# Patient Record
Sex: Female | Born: 1995 | Race: Black or African American | Hispanic: No | Marital: Married | Smoking: Former smoker
Health system: Southern US, Community
[De-identification: ages and names within clinical notes are randomized; demographics above are authoritative.]

## PROBLEM LIST (undated history)

## (undated) ENCOUNTER — Inpatient Hospital Stay (HOSPITAL_COMMUNITY): Payer: Self-pay

## (undated) DIAGNOSIS — N12 Tubulo-interstitial nephritis, not specified as acute or chronic: Secondary | ICD-10-CM

## (undated) DIAGNOSIS — E119 Type 2 diabetes mellitus without complications: Secondary | ICD-10-CM

## (undated) DIAGNOSIS — O24419 Gestational diabetes mellitus in pregnancy, unspecified control: Secondary | ICD-10-CM

## (undated) DIAGNOSIS — I10 Essential (primary) hypertension: Secondary | ICD-10-CM

## (undated) HISTORY — PX: NO PAST SURGERIES: SHX2092

---

## 2014-01-03 ENCOUNTER — Emergency Department (INDEPENDENT_AMBULATORY_CARE_PROVIDER_SITE_OTHER)
Admission: EM | Admit: 2014-01-03 | Discharge: 2014-01-03 | Disposition: A | Discharge status: Home / Self Care | Payer: Medicaid Other | Source: Home / Self Care | Attending: Family Medicine | Admitting: Family Medicine

## 2014-01-03 ENCOUNTER — Encounter (HOSPITAL_COMMUNITY): Payer: Self-pay | Admitting: Emergency Medicine

## 2014-01-03 DIAGNOSIS — N12 Tubulo-interstitial nephritis, not specified as acute or chronic: Secondary | ICD-10-CM

## 2014-01-03 HISTORY — DX: Essential (primary) hypertension: I10

## 2014-01-03 LAB — POCT URINALYSIS DIP (DEVICE)
BILIRUBIN URINE: NEGATIVE
Glucose, UA: NEGATIVE mg/dL
KETONES UR: 40 mg/dL — AB
Nitrite: POSITIVE — AB
PH: 6 (ref 5.0–8.0)
Protein, ur: 100 mg/dL — AB
Specific Gravity, Urine: >=1.03 (ref 1.005–1.030)
Urobilinogen, UA: 0.2 mg/dL (ref 0.0–1.0)

## 2014-01-03 LAB — POCT PREGNANCY, URINE: Preg Test, Ur: NEGATIVE

## 2014-01-03 MED ORDER — CEFTRIAXONE SODIUM 1 G IJ SOLR
INTRAMUSCULAR | Status: AC
  Filled 2014-01-03: qty 10

## 2014-01-03 MED ORDER — CEPHALEXIN 500 MG PO CAPS: 500.0000 mg | ORAL_CAPSULE | Freq: Four times a day (QID) | ORAL | Status: DC

## 2014-01-03 MED ORDER — PHENAZOPYRIDINE HCL 95 MG PO TABS: 95.0000 mg | ORAL_TABLET | Freq: Three times a day (TID) | ORAL | Status: DC | PRN

## 2014-01-03 MED ORDER — LIDOCAINE HCL (PF) 1 % IJ SOLN
INTRAMUSCULAR | Status: AC
  Filled 2014-01-03: qty 5

## 2014-01-03 MED ORDER — CEFTRIAXONE SODIUM 1 G IJ SOLR
1.0000 g | Freq: Once | INTRAMUSCULAR | Status: AC
  Administered 2014-01-03: 1 g via INTRAMUSCULAR

## 2014-01-03 NOTE — ED Notes (Signed)
Pt given injection will discharge at 12:00 p.m.  Mw,cma

## 2014-01-03 NOTE — ED Notes (Signed)
C/o  Lower right sided flank and back pain.  Since Tuesday.   Dysuria.  Urgency.  Bladder spasms.  Vomiting.   Fever.   Tylenol taken at 9:30 a.m.

## 2014-01-03 NOTE — Discharge Instructions (Signed)
Thank you for coming in today. Start taking Keflex this evening. Take every 6 hours.  Use Tylenol and Pyridium as needed for pain. Return if not improving or worsening. Go to the emergency room if you get dramatically worse. If your belly pain worsens, or you have high fever, bad vomiting, blood in your stool or black tarry stool go to the Emergency Room.    Pyelonephritis, Adult Pyelonephritis is a kidney infection. In general, there are 2 main types of pyelonephritis:  Infections that come on quickly without any warning (acute pyelonephritis).  Infections that persist for a long period of time (chronic pyelonephritis). CAUSES  Two main causes of pyelonephritis are:  Bacteria traveling from the bladder to the kidney. This is a problem especially in pregnant women. The urine in the bladder can become filled with bacteria from multiple causes, including:  Inflammation of the prostate gland (prostatitis).  Sexual intercourse in females.  Bladder infection (cystitis).  Bacteria traveling from the bloodstream to the tissue part of the kidney. Problems that may increase your risk of getting a kidney infection include:  Diabetes.  Kidney stones or bladder stones.  Cancer.  Catheters placed in the bladder.  Other abnormalities of the kidney or ureter. SYMPTOMS   Abdominal pain.  Pain in the side or flank area.  Fever.  Chills.  Upset stomach.  Blood in the urine (dark urine).  Frequent urination.  Strong or persistent urge to urinate.  Burning or stinging when urinating. DIAGNOSIS  Your caregiver may diagnose your kidney infection based on your symptoms. A urine sample may also be taken. TREATMENT  In general, treatment depends on how severe the infection is.   If the infection is mild and caught early, your caregiver may treat you with oral antibiotics and send you home.  If the infection is more severe, the bacteria may have gotten into the bloodstream. This  will require intravenous (IV) antibiotics and a hospital stay. Symptoms may include:  High fever.  Severe flank pain.  Shaking chills.  Even after a hospital stay, your caregiver may require you to be on oral antibiotics for a period of time.  Other treatments may be required depending upon the cause of the infection. HOME CARE INSTRUCTIONS   Take your antibiotics as directed. Finish them even if you start to feel better.  Make an appointment to have your urine checked to make sure the infection is gone.  Drink enough fluids to keep your urine clear or pale yellow.  Take medicines for the bladder if you have urgency and frequency of urination as directed by your caregiver. SEEK IMMEDIATE MEDICAL CARE IF:   You have a fever or persistent symptoms for more than 2-3 days.  You have a fever and your symptoms suddenly get worse.  You are unable to take your antibiotics or fluids.  You develop shaking chills.  You experience extreme weakness or fainting.  There is no improvement after 2 days of treatment. MAKE SURE YOU:  Understand these instructions.  Will watch your condition.  Will get help right away if you are not doing well or get worse. Document Released: 08/09/2005 Document Revised: 02/08/2012 Document Reviewed: 01/13/2011 [facility] Patient Information 2014 [place]Care, [place].

## 2014-01-03 NOTE — ED Provider Notes (Signed)
Jill Madden is a 18 y.o. female who presents to Urgent Care today for 2 days of urinary frequency and urgency. Symptoms worsened yesterday to include right flank pain. She notes subjective fevers and chills. She's had one or 2 episodes of vomiting. She denies any vaginal discharge. She says her symptoms are worse than but consistent with prior UTI. She's tried Tylenol which helps her pain some.   Past Medical History  Diagnosis Date  . Hypertension    History  Substance Use Topics  . Smoking status: Current Every Day Smoker -- 0.50 packs/day    Types: Cigarettes  . Smokeless tobacco: Not on file  . Alcohol Use: Yes   ROS as above Medications: Current Facility-Administered Medications  Medication Dose Route Frequency Provider Last Rate Last Dose  . cefTRIAXone (ROCEPHIN) injection 1 g  1 g Intramuscular Once Rodolph BongEvan S Farouk Vivero, MD       Current Outpatient Prescriptions  Medication Sig Dispense Refill  . cephALEXin (KEFLEX) 500 MG capsule Take 1 capsule (500 mg total) by mouth 4 (four) times daily.  40 capsule  0  . phenazopyridine (PYRIDIUM) 95 MG tablet Take 1 tablet (95 mg total) by mouth 3 (three) times daily as needed for pain.  10 tablet  0    Exam:  BP 127/81  Pulse 90  Temp(Src) 100.8 F (38.2 C) (Oral)  Resp 18  SpO2 95% Gen: Well NAD HEENT: EOMI,  MMM Lungs: Normal work of breathing. CTABL Heart: RRR no MRG Abd: NABS, Soft. NT, ND right-sided CVA tenderness to percussion present Exts: Brisk capillary refill, warm and well perfused.   Patient was given 1 g IM ceftriaxone  Results for orders placed during the hospital encounter of 01/03/14 (from the past 24 hour(s))  POCT URINALYSIS DIP (DEVICE)     Status: Abnormal   Collection Time    01/03/14 11:18 AM      Result Value Ref Range   Glucose, UA NEGATIVE  NEGATIVE mg/dL   Bilirubin Urine NEGATIVE  NEGATIVE   Ketones, ur 40 (*) NEGATIVE mg/dL   Specific Gravity, Urine >=1.030  1.005 - 1.030   Hgb urine dipstick  LARGE (*) NEGATIVE   pH 6.0  5.0 - 8.0   Protein, ur 100 (*) NEGATIVE mg/dL   Urobilinogen, UA 0.2  0.0 - 1.0 mg/dL   Nitrite POSITIVE (*) NEGATIVE   Leukocytes, UA SMALL (*) NEGATIVE  POCT PREGNANCY, URINE     Status: None   Collection Time    01/03/14 11:26 AM      Result Value Ref Range   Preg Test, Ur NEGATIVE  NEGATIVE   No results found.  Assessment and Plan: 18 y.o. female with pyelonephritis. Plan to treat with IM ceftriaxone (1 g) in office today. Additionally sent home with oral Keflex prescription. Additionally perennial for pain as needed. Return to clinic if not improving significantly. Urine culture pending  Discussed warning signs or symptoms. Please see discharge instructions. Patient expresses understanding.    Rodolph BongEvan S Daxon Kyne, MD 01/03/14 [phone]

## 2014-01-05 LAB — URINE CULTURE: Colony Count: >=100000

## 2014-06-19 ENCOUNTER — Encounter (HOSPITAL_COMMUNITY): Payer: Self-pay | Admitting: Emergency Medicine

## 2014-06-19 ENCOUNTER — Other Ambulatory Visit (HOSPITAL_COMMUNITY)
Admission: RE | Admit: 2014-06-19 | Discharge: 2014-06-19 | Disposition: A | Discharge status: Home / Self Care | Payer: Medicaid Other | Source: Ambulatory Visit | Attending: Emergency Medicine | Admitting: Emergency Medicine

## 2014-06-19 ENCOUNTER — Emergency Department (INDEPENDENT_AMBULATORY_CARE_PROVIDER_SITE_OTHER)
Admission: EM | Admit: 2014-06-19 | Discharge: 2014-06-19 | Disposition: A | Discharge status: Home / Self Care | Payer: Medicaid Other | Source: Home / Self Care | Attending: Emergency Medicine | Admitting: Emergency Medicine

## 2014-06-19 DIAGNOSIS — Z113 Encounter for screening for infections with a predominantly sexual mode of transmission: Secondary | ICD-10-CM | POA: Insufficient documentation

## 2014-06-19 DIAGNOSIS — N760 Acute vaginitis: Secondary | ICD-10-CM

## 2014-06-19 DIAGNOSIS — N3000 Acute cystitis without hematuria: Secondary | ICD-10-CM

## 2014-06-19 LAB — POCT URINALYSIS DIP (DEVICE)
Bilirubin Urine: NEGATIVE
Glucose, UA: NEGATIVE mg/dL
Ketones, ur: 80 mg/dL — AB
LEUKOCYTES UA: NEGATIVE
NITRITE: NEGATIVE
PH: 6 (ref 5.0–8.0)
Protein, ur: NEGATIVE mg/dL
Specific Gravity, Urine: >=1.03 (ref 1.005–1.030)
UROBILINOGEN UA: 1 mg/dL (ref 0.0–1.0)

## 2014-06-19 LAB — POCT PREGNANCY, URINE: Preg Test, Ur: NEGATIVE

## 2014-06-19 MED ORDER — CEPHALEXIN 500 MG PO CAPS: 500.0000 mg | ORAL_CAPSULE | Freq: Three times a day (TID) | ORAL | Status: DC

## 2014-06-19 MED ORDER — METRONIDAZOLE 500 MG PO TABS: 500.0000 mg | ORAL_TABLET | Freq: Two times a day (BID) | ORAL | Status: DC

## 2014-06-19 NOTE — ED Provider Notes (Signed)
Chief Complaint   Vaginal Discharge and Urinary Tract Infection   History of Present Illness   Jill Madden is a 18 year old female who has a one-week history of sharp, bilateral flank pain that comes and goes. The pains can last for seconds to minutes at a time and the pain is now rated as 6/10 at its most although sometimes it's 0. She tried taking some antibiotics that she had leftover without relief. She also has had a one-week history of sharp pain radiating down her entire right leg to her ankle but not her foot. She denies any numbness or tingling or weakness. There has been no bladder or bowel dysfunction or saddle anesthesia. She also notes a 2 week history of urinary frequency, urgency, urinary odor, and bladder spasms. For the past 2 weeks she's had heavy discharge with odor. She denies any vaginal itching. She has a history of urinary tract infections in the past and states this is what she feels like when she has a urinary tract infection. Her last menstrual period began today. She is sexually active without use of birth control. She does not think she is pregnant. She denies any fever, chills, nausea, or vomiting. There is no back pain.  Review of Systems   Other than as noted above, the patient denies any of the following symptoms: Systemic:  No fever or chills GI:  No abdominal pain, nausea, vomiting, diarrhea, constipation, melena or hematochezia. GU:  No dysuria, frequency, urgency, hematuria, vaginal discharge, itching, or abnormal vaginal bleeding.  PMFSH   Past medical history, family history, social history, meds, and allergies were reviewed.    Physical Examination    Vital signs:  BP 124/84  Pulse 64  Temp(Src) 98.2 F (36.8 C) (Oral)  Resp 18  SpO2 100%  LMP 06/19/2014 General:  Alert, oriented and in no distress. Lungs:  Breath sounds clear and equal bilaterally.  No wheezes, rales or rhonchi. Heart:  Regular rhythm.  No gallops or murmers. Abdomen:   Soft, flat and non-distended.  No organomegaly or mass.  No tenderness, guarding or rebound.  Bowel sounds normally active. Pelvic exam:  Normal external genitalia. Vaginal and cervical mucosa were unremarkable. There was some blood in the vaginal vault. No discharge. No pain on cervical motion. Uterus was normal in size and shape and nontender. No adnexal masses or tenderness.  DNA probes for gonorrhea, Chlamydia, Trichomonas, Gardnerella, Candida were obtained. Skin:  Clear, warm and dry.  Chaperoned by Arcola JanskyLivia Sneed, EMT who was present throughout the pelvic exam.   Labs   Results for orders placed during the hospital encounter of 06/19/14  POCT URINALYSIS DIP (DEVICE)      Result Value Ref Range   Glucose, UA NEGATIVE  NEGATIVE mg/dL   Bilirubin Urine NEGATIVE  NEGATIVE   Ketones, ur 80 (*) NEGATIVE mg/dL   Specific Gravity, Urine >=1.030  1.005 - 1.030   Hgb urine dipstick TRACE (*) NEGATIVE   pH 6.0  5.0 - 8.0   Protein, ur NEGATIVE  NEGATIVE mg/dL   Urobilinogen, UA 1.0  0.0 - 1.0 mg/dL   Nitrite NEGATIVE  NEGATIVE   Leukocytes, UA NEGATIVE  NEGATIVE  POCT PREGNANCY, URINE      Result Value Ref Range   Preg Test, Ur NEGATIVE  NEGATIVE    Assessment   The primary encounter diagnosis was Acute cystitis without hematuria. A diagnosis of Vaginitis was also pertinent to this visit.  No evidence of pyelonephritis since she does not have a  fever or vomiting, and there is no evidence of PID since she is not tender on pelvic exam.       Plan    1.  Meds:  The following meds were prescribed:   Discharge Medication List as of 06/19/2014 12:41 PM    START taking these medications   Details  !! cephALEXin (KEFLEX) 500 MG capsule Take 1 capsule (500 mg total) by mouth 3 (three) times daily., Starting 06/19/2014, Until Discontinued, Normal    metroNIDAZOLE (FLAGYL) 500 MG tablet Take 1 tablet (500 mg total) by mouth 2 (two) times daily., Starting 06/19/2014, Until Discontinued, Normal      !! - Potential duplicate medications found. Please discuss with provider.      2.  Patient Education/Counseling:  The patient was given appropriate handouts, self care instructions, and instructed in symptomatic relief.    3.  Follow up:  The patient was told to follow up here if no better in 3 to 4 days, or sooner if becoming worse in any way, and given some red flag symptoms such as worsening pain, fever, persistent vomiting, or heavy vaginal bleeding which would prompt immediate return.       Reuben Likesavid C Akif Weldy, MD 06/19/14 [phone]

## 2014-06-19 NOTE — Discharge Instructions (Signed)
Bacterial Vaginosis °Bacterial vaginosis is a vaginal infection that occurs when the normal balance of bacteria in the vagina is disrupted. It results from an overgrowth of certain bacteria. This is the most common vaginal infection in women of childbearing age. Treatment is important to prevent complications, especially in pregnant women, as it can cause a premature delivery. °CAUSES  °Bacterial vaginosis is caused by an increase in harmful bacteria that are normally present in smaller amounts in the vagina. Several different kinds of bacteria can cause bacterial vaginosis. However, the reason that the condition develops is not fully understood. °RISK FACTORS °Certain activities or behaviors can put you at an increased risk of developing bacterial vaginosis, including: °· Having a new sex partner or multiple sex partners. °· Douching. °· Using an intrauterine device (IUD) for contraception. °Women do not get bacterial vaginosis from toilet seats, bedding, swimming pools, or contact with objects around them. °SIGNS AND SYMPTOMS  °Some women with bacterial vaginosis have no signs or symptoms. Common symptoms include: °· Grey vaginal discharge. °· A fishlike odor with discharge, especially after sexual intercourse. °· Itching or burning of the vagina and vulva. °· Burning or pain with urination. °DIAGNOSIS  °Your health care provider will take a medical history and examine the vagina for signs of bacterial vaginosis. A sample of vaginal fluid may be taken. Your health care provider will look at this sample under a microscope to check for bacteria and abnormal cells. A vaginal pH test may also be done.  °TREATMENT  °Bacterial vaginosis may be treated with antibiotic medicines. These may be given in the form of a pill or a vaginal cream. A second round of antibiotics may be prescribed if the condition comes back after treatment.  °HOME CARE INSTRUCTIONS  °· Only take over-the-counter or prescription medicines as  directed by your health care provider. °· If antibiotic medicine was prescribed, take it as directed. Make sure you finish it even if you start to feel better. °· Do not have sex until treatment is completed. °· Tell all sexual partners that you have a vaginal infection. They should see their health care provider and be treated if they have problems, such as a mild rash or itching. °· Practice safe sex by using condoms and only having one sex partner. °SEEK MEDICAL CARE IF:  °· Your symptoms are not improving after 3 days of treatment. °· You have increased discharge or pain. °· You have a fever. °MAKE SURE YOU:  °· Understand these instructions. °· Will watch your condition. °· Will get help right away if you are not doing well or get worse. °FOR MORE INFORMATION  °Centers for Disease Control and Prevention, Division of STD Prevention: www.cdc.gov/std °American Sexual Health Association (ASHA): www.ashastd.org  °Document Released: 08/09/2005 Document Revised: 05/30/2013 Document Reviewed: 03/21/2013 °ExitCare® Patient Information ©2015 [employer]. This information is not intended to replace advice given to you by your health care provider. Make sure you discuss any questions you have with your health care provider. °Urinary Tract Infection °Urinary tract infections (UTIs) can develop anywhere along your urinary tract. Your urinary tract is your body's drainage system for removing wastes and extra water. Your urinary tract includes two kidneys, two ureters, a bladder, and a urethra. Your kidneys are a pair of bean-shaped organs. Each kidney is about the size of your fist. They are located below your ribs, one on each side of your spine. °CAUSES °Infections are caused by microbes, which are microscopic organisms, including fungi, viruses, and   bacteria. These organisms are so small that they can only be seen through a microscope. Bacteria are the microbes that most commonly cause UTIs. °SYMPTOMS  °Symptoms of UTIs  may vary by age and gender of the patient and by the location of the infection. Symptoms in young women typically include a frequent and intense urge to urinate and a painful, burning feeling in the bladder or urethra during urination. Older women and men are more likely to be tired, shaky, and weak and have muscle aches and abdominal pain. A fever may mean the infection is in your kidneys. Other symptoms of a kidney infection include pain in your back or sides below the ribs, nausea, and vomiting. °DIAGNOSIS °To diagnose a UTI, your caregiver will ask you about your symptoms. Your caregiver also will ask to provide a urine sample. The urine sample will be tested for bacteria and white blood cells. White blood cells are made by your body to help fight infection. °TREATMENT  °Typically, UTIs can be treated with medication. Because most UTIs are caused by a bacterial infection, they usually can be treated with the use of antibiotics. The choice of antibiotic and length of treatment depend on your symptoms and the type of bacteria causing your infection. °HOME CARE INSTRUCTIONS °· If you were prescribed antibiotics, take them exactly as your caregiver instructs you. Finish the medication even if you feel better after you have only taken some of the medication. °· Drink enough water and fluids to keep your urine clear or pale yellow. °· Avoid caffeine, tea, and carbonated beverages. They tend to irritate your bladder. °· Empty your bladder often. Avoid holding urine for long periods of time. °· Empty your bladder before and after sexual intercourse. °· After a bowel movement, women should cleanse from front to back. Use each tissue only once. °SEEK MEDICAL CARE IF:  °· You have back pain. °· You develop a fever. °· Your symptoms do not begin to resolve within 3 days. °SEEK IMMEDIATE MEDICAL CARE IF:  °· You have severe back pain or lower abdominal pain. °· You develop chills. °· You have nausea or vomiting. °· You have  continued burning or discomfort with urination. °MAKE SURE YOU:  °· Understand these instructions. °· Will watch your condition. °· Will get help right away if you are not doing well or get worse. °Document Released: 05/19/2005 Document Revised: 02/08/2012 Document Reviewed: 09/17/2011 °ExitCare® Patient Information ©2015 [employer]. This information is not intended to replace advice given to you by your health care provider. Make sure you discuss any questions you have with your health care provider. ° °

## 2014-06-19 NOTE — ED Notes (Signed)
C/o white vag d/c and UTI sx onset 1 week Sx include: back pain, urinary freq/urgency/incontinence, abd pain Alert, no signs of acute distress.

## 2014-06-20 LAB — CERVICOVAGINAL ANCILLARY ONLY
Chlamydia: NEGATIVE
Neisseria Gonorrhea: NEGATIVE
WET PREP (BD AFFIRM): NEGATIVE
Wet Prep (BD Affirm): NEGATIVE
Wet Prep (BD Affirm): NEGATIVE

## 2014-06-20 NOTE — Progress Notes (Signed)
Quick Note:  Results are abnormal as noted, but have been adequately treated. No further action necessary. ______ 

## 2014-06-22 LAB — URINE CULTURE: Colony Count: >=100000

## 2014-06-22 NOTE — Progress Notes (Signed)
Quick Note:  Results are abnormal as noted, but have been adequately treated. No further action necessary. ______ 

## 2014-06-25 NOTE — ED Notes (Signed)
GC/Chlamydia/Affirm all neg., Urine culture: >100,000 colonies E. Coli.  Pt. adequately treated with Keflex. [name], [name] 06/25/2014

## 2014-11-13 ENCOUNTER — Emergency Department (HOSPITAL_COMMUNITY)
Admission: EM | Admit: 2014-11-13 | Discharge: 2014-11-13 | Disposition: A | Discharge status: Home / Self Care | Payer: Medicaid Other | Attending: Emergency Medicine | Admitting: Emergency Medicine

## 2014-11-13 ENCOUNTER — Encounter (HOSPITAL_COMMUNITY): Payer: Self-pay | Admitting: Emergency Medicine

## 2014-11-13 DIAGNOSIS — Z792 Long term (current) use of antibiotics: Secondary | ICD-10-CM | POA: Insufficient documentation

## 2014-11-13 DIAGNOSIS — I10 Essential (primary) hypertension: Secondary | ICD-10-CM | POA: Diagnosis not present

## 2014-11-13 DIAGNOSIS — Z720 Tobacco use: Secondary | ICD-10-CM | POA: Diagnosis not present

## 2014-11-13 DIAGNOSIS — J029 Acute pharyngitis, unspecified: Secondary | ICD-10-CM | POA: Diagnosis present

## 2014-11-13 LAB — RAPID STREP SCREEN (MED CTR MEBANE ONLY): Streptococcus, Group A Screen (Direct): NEGATIVE

## 2014-11-13 MED ORDER — AMOXICILLIN 500 MG PO CAPS: 500.0000 mg | ORAL_CAPSULE | Freq: Three times a day (TID) | ORAL | Status: DC

## 2014-11-13 NOTE — Discharge Instructions (Signed)

## 2014-11-13 NOTE — ED Notes (Signed)
Pt is in stable condition upon d/c and ambulates from ED escorted by staff. 

## 2014-11-13 NOTE — ED Notes (Signed)
Pt reports sore throat x 3 days  With cough, sneezing, and runny nose. sts her boyfriend had a sore throat first and now she does. Denies fever chills.

## 2014-11-13 NOTE — ED Provider Notes (Signed)
CSN: [id number]     Arrival date & time 11/13/14  1414 History  This chart was scribed for non-physician practitioner, Arthor CaptainAbigail Argelia Formisano, PA-C, working with No att. providers found by Charline BillsEssence Howell, ED Scribe. This patient was seen in room TR06C/TR06C and the patient's care was started at 3:52 PM.   Chief Complaint  Patient presents with  . Sore Throat   The history is provided by the patient. No language interpreter was used.   HPI Comments: Jill Madden is a 19 y.o. female, with a h/o HTN, who presents to the Emergency Department complaining of persistent sore throat, L side worse than R, for the past few days. Pain is exacerbated with swallowing. She denies fever and trouble swallowing. Pt states that her best friend and boy friend have been ill with similar symptoms over the past 2 weeks.   Past Medical History  Diagnosis Date  . Hypertension    History reviewed. No pertinent past surgical history. No family history on file. History  Substance Use Topics  . Smoking status: Current Every Day Smoker -- 0.50 packs/day    Types: Cigarettes  . Smokeless tobacco: Not on file  . Alcohol Use: Yes   OB History    No data available     Review of Systems  Constitutional: Negative for fever and chills.  HENT: Positive for sore throat. Negative for trouble swallowing.    Allergies  Review of patient's allergies indicates no known allergies.  Home Medications   Prior to Admission medications   Medication Sig Start Date End Date Taking? Authorizing Provider  amoxicillin (AMOXIL) 500 MG capsule Take 1 capsule (500 mg total) by mouth 3 (three) times daily. 11/13/14   Arthor CaptainAbigail Margaretann Abate, PA-C  cephALEXin (KEFLEX) 500 MG capsule Take 1 capsule (500 mg total) by mouth 4 (four) times daily. 01/03/14   Rodolph BongEvan S Corey, MD  cephALEXin (KEFLEX) 500 MG capsule Take 1 capsule (500 mg total) by mouth 3 (three) times daily. 06/19/14   Reuben Likesavid C Keller, MD  metroNIDAZOLE (FLAGYL) 500 MG tablet Take 1 tablet  (500 mg total) by mouth 2 (two) times daily. 06/19/14   Reuben Likesavid C Keller, MD  phenazopyridine (PYRIDIUM) 95 MG tablet Take 1 tablet (95 mg total) by mouth 3 (three) times daily as needed for pain. 01/03/14   Rodolph BongEvan S Corey, MD   BP 126/98 mmHg  Pulse 66  Temp(Src) 98.6 F (37 C) (Oral)  Resp 16  Ht 5\' 7"  (1.702 m)  SpO2 100%  LMP 10/20/2014 Physical Exam  Constitutional: She is oriented to person, place, and time. She appears well-developed and well-nourished. No distress.  HENT:  Head: Normocephalic and atraumatic.  Mouth/Throat: Posterior oropharyngeal erythema (mild) present.  Eyes: Conjunctivae and EOM are normal.  Neck: Neck supple. No tracheal deviation present.  Cardiovascular: Normal rate.   Pulmonary/Chest: Effort normal. No respiratory distress.  Musculoskeletal: Normal range of motion.  Neurological: She is alert and oriented to person, place, and time.  Skin: Skin is warm and dry.  Psychiatric: She has a normal mood and affect. Her behavior is normal.  Nursing note and vitals reviewed.  ED Course  Procedures (including critical care time) DIAGNOSTIC STUDIES: Oxygen Saturation is 100% on RA, normal by my interpretation.    COORDINATION OF CARE: 3:53 PM-Discussed treatment plan which includes strep screen with pt at bedside and pt agreed to plan.   Labs Review Labs Reviewed  CULTURE, GROUP A STREP - Abnormal; Notable for the following:    Strep A  Culture Comment (*)    All other components within normal limits  RAPID STREP SCREEN   Imaging Review No results found.   EKG Interpretation None      MDM   Final diagnoses:  Pharyngitis    Patient with pharyngitis. Exposure to strep. Will treat with amoxil. She is able to tolerate po fluids. Safe for discharge.  I personally performed the services described in this documentation, which was scribed in my presence. The recorded information has been reviewed and is accurate.      Arthor Captain,  PA-C 11/18/14 1710  Mancel Bale, MD 11/22/14 0012  Mancel Bale, MD 11/22/14 [phone]

## 2014-11-16 LAB — CULTURE, GROUP A STREP

## 2015-04-03 ENCOUNTER — Emergency Department (HOSPITAL_COMMUNITY)
Admission: EM | Admit: 2015-04-03 | Discharge: 2015-04-03 | Disposition: A | Discharge status: Home / Self Care | Payer: Medicaid Other | Attending: Emergency Medicine | Admitting: Emergency Medicine

## 2015-04-03 ENCOUNTER — Encounter (HOSPITAL_COMMUNITY): Payer: Self-pay | Admitting: Emergency Medicine

## 2015-04-03 DIAGNOSIS — Z793 Long term (current) use of hormonal contraceptives: Secondary | ICD-10-CM | POA: Insufficient documentation

## 2015-04-03 DIAGNOSIS — I10 Essential (primary) hypertension: Secondary | ICD-10-CM | POA: Insufficient documentation

## 2015-04-03 DIAGNOSIS — N390 Urinary tract infection, site not specified: Secondary | ICD-10-CM

## 2015-04-03 DIAGNOSIS — Z720 Tobacco use: Secondary | ICD-10-CM | POA: Insufficient documentation

## 2015-04-03 HISTORY — DX: Tubulo-interstitial nephritis, not specified as acute or chronic: N12

## 2015-04-03 LAB — URINALYSIS, ROUTINE W REFLEX MICROSCOPIC
Bilirubin Urine: NEGATIVE
Glucose, UA: NEGATIVE mg/dL
KETONES UR: NEGATIVE mg/dL
NITRITE: NEGATIVE
PH: 5.5 (ref 5.0–8.0)
Protein, ur: NEGATIVE mg/dL
Specific Gravity, Urine: 1.019 (ref 1.005–1.030)
Urobilinogen, UA: 0.2 mg/dL (ref 0.0–1.0)

## 2015-04-03 LAB — CBC
HEMATOCRIT: 39.3 % (ref 36.0–46.0)
Hemoglobin: 13.7 g/dL (ref 12.0–15.0)
MCH: 31.9 pg (ref 26.0–34.0)
MCHC: 34.9 g/dL (ref 30.0–36.0)
MCV: 91.4 fL (ref 78.0–100.0)
PLATELETS: 264 10*3/uL (ref 150–400)
RBC: 4.3 MIL/uL (ref 3.87–5.11)
RDW: 12.1 % (ref 11.5–15.5)
WBC: 7 10*3/uL (ref 4.0–10.5)

## 2015-04-03 LAB — COMPREHENSIVE METABOLIC PANEL
ALT: 18 U/L (ref 14–54)
AST: 17 U/L (ref 15–41)
Albumin: 4.1 g/dL (ref 3.5–5.0)
Alkaline Phosphatase: 71 U/L (ref 38–126)
Anion gap: 9 (ref 5–15)
BUN: 16 mg/dL (ref 6–20)
CO2: 25 mmol/L (ref 22–32)
Calcium: 9.4 mg/dL (ref 8.9–10.3)
Chloride: 102 mmol/L (ref 101–111)
Creatinine, Ser: 0.83 mg/dL (ref 0.44–1.00)
GFR calc Af Amer: >60 mL/min (ref >60)
GFR calc non Af Amer: >60 mL/min (ref >60)
Glucose, Bld: 126 mg/dL — ABNORMAL HIGH (ref 65–99)
Potassium: 3.9 mmol/L (ref 3.5–5.1)
Sodium: 136 mmol/L (ref 135–145)
Total Bilirubin: 1 mg/dL (ref 0.3–1.2)
Total Protein: 7.2 g/dL (ref 6.5–8.1)

## 2015-04-03 LAB — URINE MICROSCOPIC-ADD ON

## 2015-04-03 LAB — WET PREP, GENITAL
Clue Cells Wet Prep HPF POC: NONE SEEN
Trich, Wet Prep: NONE SEEN
YEAST WET PREP: NONE SEEN

## 2015-04-03 LAB — I-STAT BETA HCG BLOOD, ED (MC, WL, AP ONLY)

## 2015-04-03 LAB — LIPASE, BLOOD: LIPASE: 30 U/L (ref 22–51)

## 2015-04-03 LAB — HCG, QUANTITATIVE, PREGNANCY: hCG, Beta Chain, Quant, S: <1 m[IU]/mL (ref <5)

## 2015-04-03 MED ORDER — PHENAZOPYRIDINE HCL 200 MG PO TABS: 200.0000 mg | ORAL_TABLET | Freq: Three times a day (TID) | ORAL | Status: DC

## 2015-04-03 MED ORDER — KETOROLAC TROMETHAMINE 60 MG/2ML IM SOLN
30.0000 mg | Freq: Once | INTRAMUSCULAR | Status: AC
Start: 2015-04-03 — End: 2015-04-03
  Administered 2015-04-03: 30 mg via INTRAMUSCULAR
  Filled 2015-04-03: qty 2

## 2015-04-03 MED ORDER — CEPHALEXIN 500 MG PO CAPS: 500.0000 mg | ORAL_CAPSULE | Freq: Three times a day (TID) | ORAL | Status: DC

## 2015-04-03 NOTE — ED Notes (Signed)
Right lower abd pain started last night, states urine is cloudy and stronger in odor. No nausea, no diarrhea.

## 2015-04-03 NOTE — ED Provider Notes (Signed)
CSN: [id number]     Arrival date & time 04/03/15  1537 History  This chart was scribed for [facility], NP, working with Melene Plan, DO by Elon Spanner, ED Scribe. This patient was seen in room TR01C/TR01C and the patient's care was started at 4:40 PM.   Chief Complaint  Patient presents with  . Abdominal Pain   Patient is a 19 y.o. female presenting with abdominal pain. The history is provided by the patient. No language interpreter was used.  Abdominal Pain Pain location:  RUQ and suprapubic Pain quality: cramping, fullness, pressure, sharp and stabbing   Pain radiates to:  R flank Pain severity:  Moderate Onset quality:  Gradual Duration:  7 days Timing:  Constant Chronicity:  New Relieved by:  Urination  HPI Comments: Jill Madden is a 19 y.o. female who presents to the Emergency Department complaining of abdominal pain.  She reports lower abdominal pain onset 1 week ago described as constant cramping with intermittent sharpness.  She also reports RUQ abdominal pain onset last night described as pressure/fullness with radiation into the back and associated with urination.  The patient reports associated white vaginal discharge, as well as nausea which resolved last night.    She denies dysuria but reports a sensation of fullness prior to urination that resolves with urination.  Patient reports a history of chlamydia treated in 2010.  Patient is not currently taking birth control an has normal menstrual cycle.  LNMP 7/25.  Patient is in a current sexual relationship with a female currently.    She denies history of pregnancy.  She denies vomiting, vaginal itching.   Past Medical History  Diagnosis Date  . Hypertension   . Pyelonephritis    History reviewed. No pertinent past surgical history. No family history on file. Social History  Substance Use Topics  . Smoking status: Current Every Day Smoker -- 0.50 packs/day    Types: Cigars  . Smokeless tobacco: None  . Alcohol Use: Yes    OB History    No data available     Review of Systems  Gastrointestinal: Positive for abdominal pain.  Musculoskeletal: Positive for back pain.  All other systems reviewed and are negative.     Allergies  Review of patient's allergies indicates no known allergies.  Home Medications   Prior to Admission medications   Medication Sig Start Date End Date Taking? Authorizing Provider  medroxyPROGESTERone (DEPO-PROVERA) 150 MG/ML injection 150 mg intramuscularly every 3 months 11/02/10  Yes Historical Provider, MD  cephALEXin (KEFLEX) 500 MG capsule Take 1 capsule (500 mg total) by mouth 3 (three) times daily. 04/03/15   Haliegh Khurana Orlene Och, NP  phenazopyridine (PYRIDIUM) 200 MG tablet Take 1 tablet (200 mg total) by mouth 3 (three) times daily. 04/03/15   Kerra Guilfoil Orlene Och, NP   BP 127/85 mmHg  Pulse 80  Temp(Src) 98.3 F (36.8 C) (Oral)  Resp 18  SpO2 100%  LMP 03/17/2015 (Exact Date) Physical Exam  Constitutional: She is oriented to person, place, and time. She appears well-developed and well-nourished. No distress.  HENT:  Head: Normocephalic and atraumatic.  Eyes: Conjunctivae and EOM are normal.  Neck: Neck supple. No tracheal deviation present.  Cardiovascular: Normal rate and regular rhythm.   Pulmonary/Chest: Effort normal. No respiratory distress.  Lungs clear  Abdominal: Soft. Bowel sounds are normal. There is no tenderness. There is CVA tenderness (right). There is no rebound.  Unable to reproduce the intermittent, lower abdominal cramping.  Some right upper quadrant  pain that radiates to the right flank area.  Tenderness is mild  Genitourinary:   External genitalia without lesions, white d/c vaginal vault, no CMT, mild left adnexal tenderness, no mass palpable. Uterus without palpable enlargement.   Musculoskeletal: Normal range of motion.  Neurological: She is alert and oriented to person, place, and time.  Skin: Skin is warm and dry.  Psychiatric: She has a normal mood  and affect. Her behavior is normal.  Nursing note and vitals reviewed.  Results for orders placed or performed during the hospital encounter of 04/03/15 (from the past 24 hour(s))  Lipase, blood     Status: None   Collection Time: 04/03/15  3:57 PM  Result Value Ref Range   Lipase 30 22 - 51 U/L  Comprehensive metabolic panel     Status: Abnormal   Collection Time: 04/03/15  3:57 PM  Result Value Ref Range   Sodium 136 135 - 145 mmol/L   Potassium 3.9 3.5 - 5.1 mmol/L   Chloride 102 101 - 111 mmol/L   CO2 25 22 - 32 mmol/L   Glucose, Bld 126 (H) 65 - 99 mg/dL   BUN 16 6 - 20 mg/dL   Creatinine, Ser 1.61 0.44 - 1.00 mg/dL   Calcium 9.4 8.9 - 09.6 mg/dL   Total Protein 7.2 6.5 - 8.1 g/dL   Albumin 4.1 3.5 - 5.0 g/dL   AST 17 15 - 41 U/L   ALT 18 14 - 54 U/L   Alkaline Phosphatase 71 38 - 126 U/L   Total Bilirubin 1.0 0.3 - 1.2 mg/dL   GFR calc non Af Amer >60 >60 mL/min   GFR calc Af Amer >60 >60 mL/min   Anion gap 9 5 - 15  CBC     Status: None   Collection Time: 04/03/15  3:57 PM  Result Value Ref Range   WBC 7.0 4.0 - 10.5 K/uL   RBC 4.30 3.87 - 5.11 MIL/uL   Hemoglobin 13.7 12.0 - 15.0 g/dL   HCT 04.5 40.9 - 81.1 %   MCV 91.4 78.0 - 100.0 fL   MCH 31.9 26.0 - 34.0 pg   MCHC 34.9 30.0 - 36.0 g/dL   RDW 91.4 78.2 - 95.6 %   Platelets 264 150 - 400 K/uL  hCG, quantitative, pregnancy     Status: None   Collection Time: 04/03/15  3:57 PM  Result Value Ref Range   hCG, Beta Chain, Quant, S <1 <5 mIU/mL  Urinalysis, Routine w reflex microscopic (not at [facility])     Status: Abnormal   Collection Time: 04/03/15  4:08 PM  Result Value Ref Range   Color, Urine YELLOW YELLOW   APPearance CLEAR CLEAR   Specific Gravity, Urine 1.019 1.005 - 1.030   pH 5.5 5.0 - 8.0   Glucose, UA NEGATIVE NEGATIVE mg/dL   Hgb urine dipstick TRACE (A) NEGATIVE   Bilirubin Urine NEGATIVE NEGATIVE   Ketones, ur NEGATIVE NEGATIVE mg/dL   Protein, ur NEGATIVE NEGATIVE mg/dL   Urobilinogen, UA 0.2  0.0 - 1.0 mg/dL   Nitrite NEGATIVE NEGATIVE   Leukocytes, UA MODERATE (A) NEGATIVE  Urine microscopic-add on     Status: Abnormal   Collection Time: 04/03/15  4:08 PM  Result Value Ref Range   Squamous Epithelial / LPF FEW (A) RARE   WBC, UA 11-20 <3 WBC/hpf   RBC / HPF 0-2 <3 RBC/hpf   Bacteria, UA MANY (A) RARE   Urine-Other MUCOUS PRESENT   I-Stat Beta  hCG blood, ED ([facility], [facility], [facility] only)     Status: None   Collection Time: 04/03/15  4:14 PM  Result Value Ref Range   I-stat hCG, quantitative <5.0 <5 mIU/mL   Comment 3          Wet prep, genital     Status: Abnormal   Collection Time: 04/03/15  5:37 PM  Result Value Ref Range   Yeast Wet Prep HPF POC NONE SEEN NONE SEEN   Trich, Wet Prep NONE SEEN NONE SEEN   Clue Cells Wet Prep HPF POC NONE SEEN NONE SEEN   WBC, Wet Prep HPF POC FEW (A) NONE SEEN    ED Course  Procedures (including critical care time)ts that have been trainCOORDINATION OF CARE:  4:50 PM Discussed treatment plan with patient at bedside.  Patient acknowledges and agrees with plan.       MDM  19 y.o. female with painful urination and lower abdominal pain that radiates to the right flank area that has worsened over the past week. Stable for d/c without fever or acute abdomen. Will treat for UTI and she will follow up at [facility] or [facility] hospital for PAP smear or other GYN problems. Discussed with the patient and all questioned fully answered. She voices understanding and agrees with plan.    Final diagnoses:  UTI (lower urinary tract infection)    I personally performed the services described in this documentation, which was scribed in my presence. The recorded information has been reviewed and is accurate.     Janne Napoleon, NP 04/03/15 1807  Melene Plan, DO 04/04/15 0008

## 2015-04-03 NOTE — Discharge Instructions (Signed)
[facility] or [facility] for your pap smear and for continued pain.

## 2015-04-04 LAB — GC/CHLAMYDIA PROBE AMP (~~LOC~~) NOT AT ARMC
CHLAMYDIA, DNA PROBE: NEGATIVE
Neisseria Gonorrhea: NEGATIVE

## 2015-04-05 LAB — URINE CULTURE

## 2015-04-07 ENCOUNTER — Telehealth (HOSPITAL_COMMUNITY): Payer: Self-pay

## 2015-04-07 NOTE — Telephone Encounter (Signed)
Post ED Visit - Positive Culture Follow-up  Culture report reviewed by antimicrobial stewardship pharmacist:  Wes Dulaney, Pharm.D., BCPS  Celedonio Miyamoto, [address].D., BCPS  Georgina Pillion, Pharm.D., BCPS  [place], [address].D., BCPS, AAHIVP  Estella Husk, Pharm.D., BCPS, AAHIVP  Elder Cyphers, [address].D., BCPS  Positive Urine culture, >/= 100,000 colonies -> E Coli Treated with Cephalexin, organism sensitive to the same and no further patient follow-up is required at this time.  Arvid Right 04/07/2015, 4:46 AM

## 2015-06-23 ENCOUNTER — Encounter (HOSPITAL_COMMUNITY): Payer: Self-pay | Admitting: Physical Medicine and Rehabilitation

## 2015-06-23 ENCOUNTER — Emergency Department (HOSPITAL_COMMUNITY)
Admission: EM | Admit: 2015-06-23 | Discharge: 2015-06-23 | Disposition: A | Discharge status: Home / Self Care | Payer: Medicaid Other | Attending: Emergency Medicine | Admitting: Emergency Medicine

## 2015-06-23 DIAGNOSIS — Y9389 Activity, other specified: Secondary | ICD-10-CM | POA: Insufficient documentation

## 2015-06-23 DIAGNOSIS — O9A219 Injury, poisoning and certain other consequences of external causes complicating pregnancy, unspecified trimester: Secondary | ICD-10-CM | POA: Insufficient documentation

## 2015-06-23 DIAGNOSIS — I10 Essential (primary) hypertension: Secondary | ICD-10-CM | POA: Insufficient documentation

## 2015-06-23 DIAGNOSIS — Y92410 Unspecified street and highway as the place of occurrence of the external cause: Secondary | ICD-10-CM | POA: Insufficient documentation

## 2015-06-23 DIAGNOSIS — Y999 Unspecified external cause status: Secondary | ICD-10-CM | POA: Insufficient documentation

## 2015-06-23 DIAGNOSIS — Z79899 Other long term (current) drug therapy: Secondary | ICD-10-CM | POA: Insufficient documentation

## 2015-06-23 DIAGNOSIS — M62838 Other muscle spasm: Secondary | ICD-10-CM | POA: Insufficient documentation

## 2015-06-23 DIAGNOSIS — Z87448 Personal history of other diseases of urinary system: Secondary | ICD-10-CM | POA: Insufficient documentation

## 2015-06-23 DIAGNOSIS — Z793 Long term (current) use of hormonal contraceptives: Secondary | ICD-10-CM | POA: Insufficient documentation

## 2015-06-23 DIAGNOSIS — M542 Cervicalgia: Secondary | ICD-10-CM

## 2015-06-23 DIAGNOSIS — Z3490 Encounter for supervision of normal pregnancy, unspecified, unspecified trimester: Secondary | ICD-10-CM

## 2015-06-23 DIAGNOSIS — F17210 Nicotine dependence, cigarettes, uncomplicated: Secondary | ICD-10-CM | POA: Insufficient documentation

## 2015-06-23 DIAGNOSIS — Z3A00 Weeks of gestation of pregnancy not specified: Secondary | ICD-10-CM | POA: Insufficient documentation

## 2015-06-23 DIAGNOSIS — S8011XA Contusion of right lower leg, initial encounter: Secondary | ICD-10-CM

## 2015-06-23 DIAGNOSIS — Z792 Long term (current) use of antibiotics: Secondary | ICD-10-CM | POA: Insufficient documentation
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
## 2016-05-02 ENCOUNTER — Encounter (HOSPITAL_COMMUNITY): Payer: Self-pay | Admitting: Emergency Medicine

## 2016-05-02 ENCOUNTER — Emergency Department (HOSPITAL_COMMUNITY)
Admission: EM | Admit: 2016-05-02 | Discharge: 2016-05-02 | Disposition: A | Discharge status: Home / Self Care | Payer: Medicaid Other | Attending: Emergency Medicine | Admitting: Emergency Medicine

## 2016-05-02 DIAGNOSIS — Z3201 Encounter for pregnancy test, result positive: Secondary | ICD-10-CM | POA: Diagnosis not present

## 2016-05-02 DIAGNOSIS — R112 Nausea with vomiting, unspecified: Secondary | ICD-10-CM | POA: Insufficient documentation

## 2016-05-02 DIAGNOSIS — Z79899 Other long term (current) drug therapy: Secondary | ICD-10-CM | POA: Insufficient documentation

## 2016-05-02 DIAGNOSIS — F17210 Nicotine dependence, cigarettes, uncomplicated: Secondary | ICD-10-CM | POA: Diagnosis not present

## 2016-05-02 DIAGNOSIS — R0789 Other chest pain: Secondary | ICD-10-CM

## 2016-05-02 DIAGNOSIS — I10 Essential (primary) hypertension: Secondary | ICD-10-CM | POA: Insufficient documentation

## 2016-05-02 DIAGNOSIS — Z3490 Encounter for supervision of normal pregnancy, unspecified, unspecified trimester: Secondary | ICD-10-CM

## 2016-05-02 LAB — CBC
HEMATOCRIT: 36.9 % (ref 36.0–46.0)
Hemoglobin: 12 g/dL (ref 12.0–15.0)
MCH: 28.1 pg (ref 26.0–34.0)
MCHC: 32.5 g/dL (ref 30.0–36.0)
MCV: 86.4 fL (ref 78.0–100.0)
Platelets: 333 10*3/uL (ref 150–400)
RBC: 4.27 MIL/uL (ref 3.87–5.11)
RDW: 13.9 % (ref 11.5–15.5)
WBC: 9.2 10*3/uL (ref 4.0–10.5)

## 2016-05-02 LAB — URINALYSIS, ROUTINE W REFLEX MICROSCOPIC
Bilirubin Urine: NEGATIVE
Glucose, UA: NEGATIVE mg/dL
Hgb urine dipstick: NEGATIVE
KETONES UR: NEGATIVE mg/dL
Leukocytes, UA: NEGATIVE
NITRITE: NEGATIVE
PH: 6 (ref 5.0–8.0)
Protein, ur: NEGATIVE mg/dL
Specific Gravity, Urine: 1.033 — ABNORMAL HIGH (ref 1.005–1.030)

## 2016-05-02 LAB — COMPREHENSIVE METABOLIC PANEL
ALK PHOS: 54 U/L (ref 38–126)
ALT: 16 U/L (ref 14–54)
AST: 14 U/L — AB (ref 15–41)
Albumin: 3.5 g/dL (ref 3.5–5.0)
Anion gap: 6 (ref 5–15)
BILIRUBIN TOTAL: 0.4 mg/dL (ref 0.3–1.2)
BUN: 13 mg/dL (ref 6–20)
CALCIUM: 9.6 mg/dL (ref 8.9–10.3)
CO2: 23 mmol/L (ref 22–32)
Chloride: 106 mmol/L (ref 101–111)
Creatinine, Ser: 0.69 mg/dL (ref 0.44–1.00)
GFR calc Af Amer: >60 mL/min (ref >60)
GFR calc non Af Amer: >60 mL/min (ref >60)
GLUCOSE: 102 mg/dL — AB (ref 65–99)
Potassium: 3.9 mmol/L (ref 3.5–5.1)
Sodium: 135 mmol/L (ref 135–145)
TOTAL PROTEIN: 7.2 g/dL (ref 6.5–8.1)

## 2016-05-02 LAB — I-STAT BETA HCG BLOOD, ED (MC, WL, AP ONLY): I-stat hCG, quantitative: >2000 m[IU]/mL — ABNORMAL HIGH (ref <5)

## 2016-05-02 LAB — LIPASE, BLOOD: Lipase: 31 U/L (ref 11–51)

## 2016-05-02 MED ORDER — PRENATAL COMPLETE 14-0.4 MG PO TABS: 1.0000 | ORAL_TABLET | Freq: Every day | ORAL | 1 refills | Status: AC

## 2016-05-02 NOTE — ED Provider Notes (Signed)
[facility]-EMERGENCY DEPT Provider Note   CSN: [id number] Arrival date & time: 05/02/16  1237     History   Chief Complaint Chief Complaint  Patient presents with  . Generalized Body Aches  . Nausea    HPI Jill Madden is a 20 y.o. female.  HPI   Patient is a 20 year old female with a history of HTN who presents to the emergency department with nausea and vomiting and body aches for 4 days. She states she is able to keep ginger ale down and some foods. She describes her body aches as frontal chest tenderness, achy to touch, 4/10, she is not taking anything for this. She denies fever, chills, dysuria, hematuria, vaginal discharge, vaginal bleeding, headaches, dizziness, syncope, chest pain, shortness of breath. Patient states she is currently not on birth control. She says her last menstrual period was July.  Past Medical History:  Diagnosis Date  . Hypertension   . Pyelonephritis     There are no active problems to display for this patient.   History reviewed. No pertinent surgical history.  OB History    No data available       Home Medications    Prior to Admission medications   Medication Sig Start Date End Date Taking? Authorizing Provider  cephALEXin (KEFLEX) 500 MG capsule Take 1 capsule (500 mg total) by mouth 3 (three) times daily. 04/03/15   Hope Orlene Och, NP  medroxyPROGESTERone (DEPO-PROVERA) 150 MG/ML injection 150 mg intramuscularly every 3 months 11/02/10   Historical Provider, MD  phenazopyridine (PYRIDIUM) 200 MG tablet Take 1 tablet (200 mg total) by mouth 3 (three) times daily. 04/03/15   Hope Orlene Och, NP  Prenatal Vit-Fe Fumarate-FA (PRENATAL COMPLETE) 14-0.4 MG TABS Take 1 tablet by mouth daily. With food 05/02/16 05/03/16  Jerre Simon, PA    Family History History reviewed. No pertinent family history.  Social History Social History  Substance Use Topics  . Smoking status: Current Every Day Smoker    Packs/day: 0.50    Types: Cigars  .  Smokeless tobacco: Not on file  . Alcohol use Yes     Allergies   Review of patient's allergies indicates no known allergies.   Review of Systems Review of Systems  Constitutional: Negative for chills and fever.  Respiratory: Negative for shortness of breath.        Anterior chest tenderness  Cardiovascular: Negative for chest pain.  Gastrointestinal: Positive for nausea and vomiting. Negative for abdominal pain and diarrhea.  Genitourinary: Negative for dysuria, hematuria, vaginal bleeding and vaginal discharge.  Musculoskeletal: Negative for back pain.  Skin: Negative for rash.  Neurological: Negative for dizziness, syncope, weakness, numbness and headaches.     Physical Exam Updated Vital Signs BP 124/79   Pulse 63   Temp 98.6 F (37 C) (Oral)   Resp 16   Ht 5\' 7"  (1.702 m)   Wt 88 kg   SpO2 99%   BMI 30.38 kg/m   Physical Exam  Constitutional: She appears well-developed and well-nourished. No distress.  HENT:  Head: Normocephalic and atraumatic.  Eyes: Conjunctivae are normal.  Cardiovascular: Normal rate, regular rhythm and normal heart sounds.  Exam reveals no gallop and no friction rub.   No murmur heard. Pulmonary/Chest: Effort normal and breath sounds normal. No respiratory distress. She has no decreased breath sounds. She has no wheezes. She has no rhonchi. She has no rales.  Abdominal: Soft. Normal appearance and bowel sounds are normal. She exhibits no distension. There is  no tenderness. There is no rigidity, no rebound, no guarding and no CVA tenderness.  Musculoskeletal: Normal range of motion.  Neurological: She is alert. Coordination normal.  Skin: Skin is warm and dry. She is not diaphoretic.  Psychiatric: She has a normal mood and affect. Her behavior is normal.  Nursing note and vitals reviewed.   ED Treatments / Results  Labs (all labs ordered are listed, but only abnormal results are displayed) Labs Reviewed  COMPREHENSIVE METABOLIC PANEL -  Abnormal; Notable for the following:       Result Value   Glucose, Bld 102 (*)    AST 14 (*)    All other components within normal limits  URINALYSIS, ROUTINE W REFLEX MICROSCOPIC (NOT [facility]) - Abnormal; Notable for the following:    APPearance CLOUDY (*)    Specific Gravity, Urine 1.033 (*)    All other components within normal limits  I-STAT BETA HCG BLOOD, ED ([facility], [facility], [facility] ONLY) - Abnormal; Notable for the following:    I-stat hCG, quantitative >2,000.0 (*)    All other components within normal limits  LIPASE, BLOOD  CBC    EKG  EKG Interpretation None       Radiology No results found.  Procedures Procedures (including critical care time)  Medications Ordered in ED Medications - No data to display   Initial Impression / Assessment and Plan / ED Course  I have reviewed the triage vital signs and the nursing notes.  Pertinent labs & imaging results that were available during my care of the patient were reviewed by me and considered in my medical decision making (see chart for details).  Clinical Course  Value Comment By Time  I-stat hCG, quantitative: (!) >2,000.0 (Reviewed) Jerre SimonJessica L Marquisha Nikolov, PA 09/10 1523   Patient presents with nausea, vomiting and frontal chest tenderness for 4 days. Patient states she's not been on birth control for a year. HCG was greater than 2000. Patient's symptoms likely 2/2 first trimester pregnancy. Patient without chest pain, shortness of breath, dizziness, syncope, VSS less concerning for PE. Will discharge patient with strict follow-[facility]. Prescription for prenatal vitamins. Discussed abstaining from alcohol or tobacco use well pregnant. Discussed ways to limit nausea. Discussed strict return precautions. Patient stable at time of discharge. Patient expressed understanding to the discharge instructions.  Pt case discussed with Dr. Denton LankSteinl who agrees with the above plan.    Final Clinical  Impressions(s) / ED Diagnoses   Final diagnoses:  Non-intractable vomiting with nausea, vomiting of unspecified type  Tenderness of chest wall  Pregnancy    New Prescriptions Discharge Medication List as of 05/02/2016  4:02 PM    START taking these medications   Details  Prenatal Vit-Fe Fumarate-FA (PRENATAL COMPLETE) 14-0.4 MG TABS Take 1 tablet by mouth daily. With food, Starting Sun 05/02/2016, Until Mon 05/03/2016, Print         Joyce CopaJessica L Ben ArnoldFocht, [place]PA 05/02/16 1627    Cathren LaineKevin Steinl, MD 05/02/16 [id number]

## 2016-05-02 NOTE — Discharge Instructions (Signed)
[facility]. Take the prenatal vitamins as prescribed. Do not drink alcohol or smoke since you are pregnant. Return to the emergency department if you experience uncontrolled vomiting, blood in your vomit, vaginal bleeding, vaginal discharge, abdominal pain or cramping, you pass out, fevers or any other concerning symptoms.

## 2016-05-02 NOTE — ED Triage Notes (Signed)
Pt sts body aches and nausea/vomiting x 4 days; pt denies nausea at present and sts LMP was in July

## 2016-05-13 ENCOUNTER — Encounter: Payer: Self-pay | Admitting: *Deleted

## 2016-05-13 ENCOUNTER — Encounter (HOSPITAL_COMMUNITY): Payer: Self-pay | Admitting: *Deleted

## 2016-05-26 ENCOUNTER — Emergency Department (HOSPITAL_COMMUNITY): Payer: Medicaid Other

## 2016-05-26 ENCOUNTER — Emergency Department (HOSPITAL_COMMUNITY)
Admission: EM | Admit: 2016-05-26 | Discharge: 2016-05-27 | Disposition: A | Discharge status: Home / Self Care | Payer: Medicaid Other | Attending: Emergency Medicine | Admitting: Emergency Medicine

## 2016-05-26 ENCOUNTER — Encounter (HOSPITAL_COMMUNITY): Payer: Self-pay | Admitting: Emergency Medicine

## 2016-05-26 DIAGNOSIS — O99511 Diseases of the respiratory system complicating pregnancy, first trimester: Secondary | ICD-10-CM | POA: Insufficient documentation

## 2016-05-26 DIAGNOSIS — Z3A12 12 weeks gestation of pregnancy: Secondary | ICD-10-CM | POA: Diagnosis not present

## 2016-05-26 DIAGNOSIS — O9989 Other specified diseases and conditions complicating pregnancy, childbirth and the puerperium: Secondary | ICD-10-CM | POA: Diagnosis present

## 2016-05-26 DIAGNOSIS — O10011 Pre-existing essential hypertension complicating pregnancy, first trimester: Secondary | ICD-10-CM | POA: Diagnosis not present

## 2016-05-26 DIAGNOSIS — Z79899 Other long term (current) drug therapy: Secondary | ICD-10-CM | POA: Diagnosis not present

## 2016-05-26 DIAGNOSIS — Z87891 Personal history of nicotine dependence: Secondary | ICD-10-CM | POA: Insufficient documentation

## 2016-05-26 DIAGNOSIS — J069 Acute upper respiratory infection, unspecified: Secondary | ICD-10-CM | POA: Diagnosis not present

## 2016-05-26 MED ORDER — ACETAMINOPHEN 325 MG PO TABS
650.0000 mg | ORAL_TABLET | Freq: Once | ORAL | Status: AC | PRN
  Administered 2016-05-26: 650 mg via ORAL

## 2016-05-26 MED ORDER — ACETAMINOPHEN 325 MG PO TABS
ORAL_TABLET | ORAL | Status: AC
  Filled 2016-05-26: qty 2

## 2016-05-26 NOTE — ED Triage Notes (Signed)
Patient here with fever and body aches that started this am.  Patient states she is having some cold chills with the fevers.  Patient states she does have a cough, non productive at this time, with nasal congestion.

## 2016-05-27 NOTE — ED Provider Notes (Signed)
[facility]-EMERGENCY DEPT Provider Note   CSN: [id number] Arrival date & time: 05/26/16  2002     History   Chief Complaint Chief Complaint  Patient presents with  . Fever  . Generalized Body Aches    HPI Verbena Madden is a 20 y.o. female.   Fever      Jill Madden is a 20 y.o. female who presents to the Emergency Department complaining of cough, fever.  She is [redacted] weeks pregnant and for the last 3 days has experienced increased nasal congestion and today developed fever, cough. Cough is nonproductive. She has associated body aches. No nausea, vomiting, dysuria, abdominal pain. 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  Past Medical History:  Diagnosis Date  . Hypertension   . Pyelonephritis     There are no active problems to display for this patient.   History reviewed. No pertinent surgical history.  OB History    Gravida Para Term Preterm AB Living   1             SAB TAB Ectopic Multiple Live Births                   Home Medications    Prior to Admission medications   Medication Sig Start Date End Date Taking? Authorizing Provider  cephALEXin (KEFLEX) 500 MG capsule Take 1 capsule (500 mg total) by mouth 3 (three) times daily. 04/03/15   Hope Orlene Och, NP  medroxyPROGESTERone (DEPO-PROVERA) 150 MG/ML injection 150 mg intramuscularly every 3 months 11/02/10   Historical Provider, MD  phenazopyridine (PYRIDIUM) 200 MG tablet Take 1 tablet (200 mg total) by mouth 3 (three) times daily. 04/03/15   Hope Orlene Och, NP    Family History History reviewed. No pertinent family history.  Social History Social History  Substance Use Topics  . Smoking status: Former Smoker    Packs/day: 0.50    Types: Cigars  . Smokeless tobacco: Never Used  . Alcohol use Yes     Allergies   Review of patient's allergies indicates no known allergies.   Review of Systems Review of Systems  Constitutional: Positive for  fever.  All other systems reviewed and are negative.    Physical Exam Updated Vital Signs BP 109/75 (BP Location: Right Arm)   Pulse 108   Temp 98.9 F (37.2 C) (Oral)   Resp 20   LMP 02/28/2016 (Exact Date)   SpO2 95%   Physical Exam  Constitutional: She is oriented to person, place, and time. She appears well-developed and well-nourished.  HENT:  Head: Normocephalic and atraumatic.  Right Ear: External ear normal.  Left Ear: External ear normal.  Mouth/Throat: Oropharynx is clear and moist.  Mild bogginess to nasal turbinates bilaterally with no facial tenderness  Cardiovascular: Normal rate and regular rhythm.   No murmur heard. Pulmonary/Chest: Effort normal and breath sounds normal. No respiratory distress.  Abdominal: Soft. There is no tenderness. There is no rebound and no guarding.  Musculoskeletal: She exhibits no edema or tenderness.  Neurological: She is alert and oriented to person, place, and time.  Skin: Skin is warm and dry.  Psychiatric: She has a normal mood and affect. Her behavior is normal.  Nursing note and vitals reviewed.    ED Treatments / Results  Labs (all labs ordered are listed, but only abnormal results are displayed) Labs Reviewed - No data to display  EKG  EKG Interpretation  None       Radiology Dg Chest 2 View  Result Date: 05/26/2016 CLINICAL DATA:  Fever, aches, cough, and congestion for 3 days. Former smoker. History of hypertension. EXAM: CHEST  2 VIEW COMPARISON:  None. FINDINGS: The heart size and mediastinal contours are within normal limits. Both lungs are clear. The visualized skeletal structures are unremarkable. IMPRESSION: No active cardiopulmonary disease. Electronically Signed   By: Burman NievesWilliam  Stevens M.D.   On: 05/26/2016 23:17    Procedures Procedures (including critical care time)  Medications Ordered in ED Medications  acetaminophen (TYLENOL) 325 MG tablet (not administered)  acetaminophen (TYLENOL) tablet 650  mg (650 mg Oral Given 05/26/16 2047)     Initial Impression / Assessment and Plan / ED Course  I have reviewed the triage vital signs and the nursing notes.  Pertinent labs & imaging results that were available during my care of the patient were reviewed by me and considered in my medical decision making (see chart for details).  Clinical Course    Patient is [redacted] weeks pregnant here with fever, cough, body aches. She is nontoxic appearing on examination with no respiratory distress. No evidence of acute pneumonia. Discussed with patient and care for likely a viral respiratory infection with oral fluid hydration and Tylenol as needed for fevers. Discussed outpatient follow-up as well as close return precautions.  Final Clinical Impressions(s) / ED Diagnoses   Final diagnoses:  Upper respiratory tract infection, unspecified type    New Prescriptions New Prescriptions   No medications on file     Tilden FossaElizabeth Melani Brisbane, MD 05/27/16 0023

## 2016-06-09 ENCOUNTER — Other Ambulatory Visit (HOSPITAL_COMMUNITY)
Admission: RE | Admit: 2016-06-09 | Discharge: 2016-06-09 | Disposition: A | Discharge status: Home / Self Care | Payer: Medicaid Other | Source: Ambulatory Visit | Attending: Obstetrics and Gynecology | Admitting: Obstetrics and Gynecology

## 2016-06-09 ENCOUNTER — Encounter: Payer: Self-pay | Admitting: Obstetrics and Gynecology

## 2016-06-09 ENCOUNTER — Ambulatory Visit (INDEPENDENT_AMBULATORY_CARE_PROVIDER_SITE_OTHER): Payer: Medicaid Other | Admitting: Obstetrics and Gynecology

## 2016-06-09 DIAGNOSIS — Z3400 Encounter for supervision of normal first pregnancy, unspecified trimester: Secondary | ICD-10-CM

## 2016-06-09 DIAGNOSIS — Z113 Encounter for screening for infections with a predominantly sexual mode of transmission: Secondary | ICD-10-CM | POA: Insufficient documentation

## 2016-06-09 DIAGNOSIS — Z3491 Encounter for supervision of normal pregnancy, unspecified, first trimester: Secondary | ICD-10-CM

## 2016-06-09 DIAGNOSIS — O0990 Supervision of high risk pregnancy, unspecified, unspecified trimester: Secondary | ICD-10-CM | POA: Insufficient documentation

## 2016-06-09 DIAGNOSIS — Z01419 Encounter for gynecological examination (general) (routine) without abnormal findings: Secondary | ICD-10-CM | POA: Insufficient documentation

## 2016-06-09 DIAGNOSIS — Z3402 Encounter for supervision of normal first pregnancy, second trimester: Secondary | ICD-10-CM

## 2016-06-09 NOTE — Patient Instructions (Signed)

## 2016-06-09 NOTE — Progress Notes (Signed)
Subjective:  Jill Madden is a 20 y.o. G2P0010 at 1610w4d being seen today for her intinalOB visit. She has had one EAB in the past, o/w no chronic medical problems.  She is currently monitored for the following issues for this low-risk pregnancy and has Supervision of normal first pregnancy, antepartum on her problem list.  Patient reports no complaints.   .  .   . Denies leaking of fluid.   The following portions of the patient's history were reviewed and updated as appropriate: allergies, current medications, past family history, past medical history, past social history, past surgical history and problem list. Problem list updated.  Objective:   Vitals:   06/09/16 1556  BP: (!) 144/87  Pulse: 92  Weight: 209 lb (94.8 kg)    Fetal Status:           General:  Alert, oriented and cooperative. Patient is in no acute distress.  Skin: Skin is warm and dry. No rash noted.   Cardiovascular: Normal heart rate noted  Respiratory: Normal respiratory effort, no problems with respiration noted  Abdomen: Soft, gravid, appropriate for gestational age.       Pelvic:  Cervical exam deferred        Extremities: Normal range of motion.     Mental Status: Normal mood and affect. Normal behavior. Normal judgment and thought content.   Urinalysis: Urine Protein: Trace Urine Glucose: Negative  Assessment and Plan:  Pregnancy: G2P0010 at 7610w4d  1. Supervision of normal first pregnancy, antepartum Declines flu vaccine - HIV antibody - Hemoglobinopathy evaluation - Varicella zoster antibody, IgG - Prenatal Profile I - Culture, OB Urine - ToxASSURE Select 13 (MW), Urine - Cytology - PAP - US MFM OB COMP + 14 WK; Future  Preterm labor symptoms and general obstetric precautions including but not limited to vaginal bleeding, contractions, leaking of fluid and fetal movement were reviewed in detail with the patient. Please refer to After Visit Summary for other counseling recommendations.  No  Follow-up on file.   Hermina StaggersMichael L Shyler Holzman, MD

## 2016-06-11 LAB — CYTOLOGY - PAP
Chlamydia: NEGATIVE
Diagnosis: NEGATIVE
NEISSERIA GONORRHEA: NEGATIVE

## 2016-06-12 LAB — URINE CULTURE, OB REFLEX

## 2016-06-12 LAB — CULTURE, OB URINE

## 2016-06-16 LAB — VARICELLA ZOSTER ANTIBODY, IGG

## 2016-06-16 LAB — PRENATAL PROFILE I(LABCORP)
Antibody Screen: NEGATIVE
BASOS ABS: 0 10*3/uL (ref 0.0–0.2)
BASOS: 0 %
EOS (ABSOLUTE): 0.1 10*3/uL (ref 0.0–0.4)
EOS: 1 %
HEMATOCRIT: 34.1 % (ref 34.0–46.6)
Hemoglobin: 11.2 g/dL (ref 11.1–15.9)
Hepatitis B Surface Ag: NEGATIVE
IMMATURE GRANS (ABS): 0 10*3/uL (ref 0.0–0.1)
Immature Granulocytes: 0 %
Lymphocytes Absolute: 1.7 10*3/uL (ref 0.7–3.1)
Lymphs: 17 %
MCH: 27.8 pg (ref 26.6–33.0)
MCHC: 32.8 g/dL (ref 31.5–35.7)
MCV: 85 fL (ref 79–97)
MONOCYTES: 6 %
Monocytes Absolute: 0.6 10*3/uL (ref 0.1–0.9)
Neutrophils Absolute: 7.7 10*3/uL — ABNORMAL HIGH (ref 1.4–7.0)
Neutrophils: 76 %
PLATELETS: 319 10*3/uL (ref 150–379)
RBC: 4.03 x10E6/uL (ref 3.77–5.28)
RDW: 14.6 % (ref 12.3–15.4)
RPR Ser Ql: NONREACTIVE
Rh Factor: POSITIVE
Rubella Antibodies, IGG: 2.7 index (ref >0.99)
WBC: 10.1 10*3/uL (ref 3.4–10.8)

## 2016-06-16 LAB — HEMOGLOBINOPATHY EVALUATION
HGB C: 0 %
HGB S: 0 %
Hemoglobin A2 Quantitation: 2.5 % (ref 0.7–3.1)
Hemoglobin F Quantitation: 0 % (ref 0.0–2.0)
Hgb A: 97.5 % (ref 94.0–98.0)

## 2016-06-16 LAB — HIV ANTIBODY (ROUTINE TESTING W REFLEX): HIV Screen 4th Generation wRfx: NONREACTIVE

## 2016-06-16 LAB — TOXASSURE SELECT 13 (MW), URINE

## 2016-07-05 ENCOUNTER — Other Ambulatory Visit: Payer: Self-pay | Admitting: Obstetrics and Gynecology

## 2016-07-05 ENCOUNTER — Ambulatory Visit (HOSPITAL_COMMUNITY)
Admission: RE | Admit: 2016-07-05 | Discharge: 2016-07-05 | Disposition: A | Discharge status: Home / Self Care | Payer: Medicaid Other | Source: Ambulatory Visit | Attending: Obstetrics and Gynecology | Admitting: Obstetrics and Gynecology

## 2016-07-05 DIAGNOSIS — Z363 Encounter for antenatal screening for malformations: Secondary | ICD-10-CM | POA: Insufficient documentation

## 2016-07-05 DIAGNOSIS — Z3A18 18 weeks gestation of pregnancy: Secondary | ICD-10-CM

## 2016-07-05 DIAGNOSIS — Z3400 Encounter for supervision of normal first pregnancy, unspecified trimester: Secondary | ICD-10-CM

## 2016-07-05 NOTE — Addendum Note (Signed)
Encounter addended by: Vivien Rotaachael H Leane Loring, RT on: 07/05/2016  5:22 PM<BR>    Actions taken: Imaging Exam ended

## 2016-07-07 ENCOUNTER — Ambulatory Visit (INDEPENDENT_AMBULATORY_CARE_PROVIDER_SITE_OTHER): Payer: Medicaid Other | Admitting: Certified Nurse Midwife

## 2016-07-07 VITALS — BP 128/84 | HR 89 | Wt 212.0 lb

## 2016-07-07 DIAGNOSIS — Z3482 Encounter for supervision of other normal pregnancy, second trimester: Secondary | ICD-10-CM

## 2016-07-07 DIAGNOSIS — R51 Headache: Secondary | ICD-10-CM

## 2016-07-07 DIAGNOSIS — O26892 Other specified pregnancy related conditions, second trimester: Secondary | ICD-10-CM

## 2016-07-07 DIAGNOSIS — Z3400 Encounter for supervision of normal first pregnancy, unspecified trimester: Secondary | ICD-10-CM

## 2016-07-07 MED ORDER — BUTALBITAL-APAP-CAFFEINE 50-325-40 MG PO TABS: 1.0000 | ORAL_TABLET | Freq: Four times a day (QID) | ORAL | 4 refills | Status: DC | PRN

## 2016-07-07 NOTE — Progress Notes (Signed)
  Subjective:    [name] is a 20 y.o. female being seen today for her obstetrical visit. She is at 4723w4d gestation. Patient reports: headache, no bleeding, no contractions, no cramping and no leaking.  States that she has tried Tylenol.  History of HA prior to pregnancy.    Problem List Items Addressed This Visit    None    Visit Diagnoses    Encounter for supervision of other normal pregnancy in second trimester    -  Primary   Relevant Orders   MaterniT21 PLUS Core+SCA   Cystic Fibrosis Mutation 97   US MFM OB FOLLOW UP   HgB A1c     Patient Active Problem List   Diagnosis Date Noted  . Supervision of normal first pregnancy, antepartum 06/09/2016    Objective:     BP 128/84   Pulse 89   Wt 212 lb (96.2 kg)   LMP 02/28/2016 (Exact Date)   BMI 33.20 kg/m  Uterine Size: Below umbilicus   FHR: 145 by doppler  Assessment:    Pregnancy @ 3023w4d  weeks Doing well, nausea improved    HA in pregnancy, H/O migraine HA  Plan:    Problem list reviewed and updated. Labs reviewed.  Follow up in 4 weeks. FIRST/CF mutation testing/NIPT/QUAD SCREEN/fragile X/Ashkenazi Jewish population testing/Spinal muscular atrophy discussed: ordered. Role of ultrasound in pregnancy discussed; fetal survey: results reviewed. Amniocentesis discussed: not indicated. 50% of 15 minute visit spent on counseling and coordination of care.

## 2016-07-08 ENCOUNTER — Telehealth: Payer: Self-pay | Admitting: *Deleted

## 2016-07-08 DIAGNOSIS — N390 Urinary tract infection, site not specified: Secondary | ICD-10-CM

## 2016-07-08 MED ORDER — NITROFURANTOIN MONOHYD MACRO 100 MG PO CAPS: 100.0000 mg | ORAL_CAPSULE | Freq: Two times a day (BID) | ORAL | 1 refills | Status: DC

## 2016-07-08 NOTE — Telephone Encounter (Signed)
Sent Macrobid to pharmacy per Dr Alysia PennaErvin request. LM on pt VM to pick up Rx from pharmacy.

## 2016-07-14 LAB — HEMOGLOBIN A1C
Est. average glucose Bld gHb Est-mCnc: 105 mg/dL
HEMOGLOBIN A1C: 5.3 % (ref 4.8–5.6)

## 2016-07-14 LAB — CYSTIC FIBROSIS MUTATION 97: Interpretation: NOT DETECTED

## 2016-07-15 LAB — MATERNIT21 PLUS CORE+SCA
CHROMOSOME 13: NEGATIVE
CHROMOSOME 18: NEGATIVE
CHROMOSOME 21: NEGATIVE
Y CHROMOSOME: DETECTED

## 2016-07-19 ENCOUNTER — Other Ambulatory Visit: Payer: Self-pay | Admitting: Certified Nurse Midwife

## 2016-07-19 DIAGNOSIS — Z3400 Encounter for supervision of normal first pregnancy, unspecified trimester: Secondary | ICD-10-CM

## 2016-07-28 ENCOUNTER — Ambulatory Visit (INDEPENDENT_AMBULATORY_CARE_PROVIDER_SITE_OTHER): Payer: Medicaid Other | Admitting: Certified Nurse Midwife

## 2016-07-28 VITALS — BP 141/79 | HR 99 | Wt 220.0 lb

## 2016-07-28 DIAGNOSIS — O162 Unspecified maternal hypertension, second trimester: Secondary | ICD-10-CM

## 2016-07-28 DIAGNOSIS — Z3400 Encounter for supervision of normal first pregnancy, unspecified trimester: Secondary | ICD-10-CM

## 2016-07-28 DIAGNOSIS — O132 Gestational [pregnancy-induced] hypertension without significant proteinuria, second trimester: Secondary | ICD-10-CM

## 2016-07-28 NOTE — Progress Notes (Signed)
Subjective:    Jill Madden is a 20 y.o. female being seen today for her obstetrical visit. She is at 4754w4d gestation. Patient reports: no bleeding, no contractions, no cramping, no leaking and acute URI . URI symptoms, denies fever, reports nasal congestion, cough, no sputum production Fetal movement: normal.  Problem List Items Addressed This Visit      Other   Supervision of normal first pregnancy, antepartum    Other Visit Diagnoses    Elevated blood pressure affecting pregnancy in second trimester, antepartum    -  Primary   Relevant Orders   Lactate dehydrogenase   Creatinine, serum   CBC   ALT   AST   Pathologist smear review   Protein / creatinine ratio, urine   Creatinine clearance, urine, 24 hour   Protein, urine, 24 hour     Patient Active Problem List   Diagnosis Date Noted  . Supervision of normal first pregnancy, antepartum 06/09/2016   Objective:    BP (!) 141/79   Pulse 99   Wt 220 lb (99.8 kg)   LMP 02/28/2016 (Exact Date)   BMI 34.46 kg/m  FHT: 140 BPM  Uterine Size: 21 cm and size equals dates     Assessment:    Pregnancy @ 6354w4d    ?Chronic hypertension   Elevated blood pressure today  Plan:   Baseline Preeclampsia labs  OBGCT: discussed. Signs and symptoms of preterm labor: discussed and handout given.  Labs, problem list reviewed and updated 2 hr GTT planned Follow up in 4 weeks.

## 2016-07-28 NOTE — Progress Notes (Signed)
Pt states she is having LE swelling. Pt states she is having increase in gassiness, causing stomach upset. Pt states she is having pain in her tailbone.

## 2016-07-29 LAB — PROTEIN / CREATININE RATIO, URINE
Creatinine, Urine: 144.9 mg/dL
PROTEIN UR: 17.7 mg/dL
Protein/Creat Ratio: 122 mg/g creat (ref 0–200)

## 2016-07-29 LAB — AST: AST: 7 IU/L (ref 0–40)

## 2016-07-29 LAB — LACTATE DEHYDROGENASE: LDH: 152 IU/L (ref 119–226)

## 2016-07-29 LAB — CBC
Hematocrit: 34.2 % (ref 34.0–46.6)
Hemoglobin: 11.3 g/dL (ref 11.1–15.9)
MCH: 28.5 pg (ref 26.6–33.0)
MCHC: 33 g/dL (ref 31.5–35.7)
MCV: 86 fL (ref 79–97)
PLATELETS: 307 10*3/uL (ref 150–379)
RBC: 3.96 x10E6/uL (ref 3.77–5.28)
RDW: 16.9 % — AB (ref 12.3–15.4)
WBC: 11.4 10*3/uL — AB (ref 3.4–10.8)

## 2016-07-29 LAB — CREATININE, SERUM
CREATININE: 0.66 mg/dL (ref 0.57–1.00)
GFR calc Af Amer: 145 mL/min/{1.73_m2} (ref >59)
GFR, EST NON AFRICAN AMERICAN: 126 mL/min/{1.73_m2} (ref >59)

## 2016-07-29 LAB — ALT: ALT: 13 IU/L (ref 0–32)

## 2016-07-30 NOTE — Addendum Note (Signed)
Addended by: Burnell BlanksMASHBURN, Jakirah Zaun on: 07/30/2016 09:02 AM   Modules accepted: Orders

## 2016-07-31 LAB — CREATININE CLEARANCE, URINE, 24 HOUR
CREAT CLEAR: 202 mL/min — AB (ref 88–128)
CREATININE 24H UR: 1829 mg/(24.h) — AB (ref 800–1800)
Creatinine, Ser: 0.63 mg/dL (ref 0.57–1.00)
Creatinine, Urine: 69 mg/dL
GFR calc Af Amer: 147 mL/min/{1.73_m2} (ref >59)
GFR calc non Af Amer: 128 mL/min/{1.73_m2} (ref >59)

## 2016-07-31 LAB — PROTEIN, URINE, 24 HOUR
PROTEIN 24H UR: 262 mg/(24.h) — AB (ref 30–150)
PROTEIN UR: 9.9 mg/dL

## 2016-08-03 LAB — PATHOLOGIST SMEAR REVIEW
Basophils Absolute: 0 10*3/uL (ref 0.0–0.2)
Basos: 0 %
EOS (ABSOLUTE): 0.1 10*3/uL (ref 0.0–0.4)
Eos: 1 %
HEMOGLOBIN: 11.6 g/dL (ref 11.1–15.9)
Hematocrit: 36.1 % (ref 34.0–46.6)
Immature Grans (Abs): 0 10*3/uL (ref 0.0–0.1)
Immature Granulocytes: 0 %
LYMPHS: 13 %
Lymphocytes Absolute: 1.5 10*3/uL (ref 0.7–3.1)
MCH: 29 pg (ref 26.6–33.0)
MCHC: 32.1 g/dL (ref 31.5–35.7)
MCV: 90 fL (ref 79–97)
MONOS ABS: 0.7 10*3/uL (ref 0.1–0.9)
Monocytes: 6 %
NEUTROS ABS: 9 10*3/uL — AB (ref 1.4–7.0)
Neutrophils: 80 %
PLATELETS: 309 10*3/uL (ref 150–379)
Path Rev PLTs: NORMAL
RBC: 4 x10E6/uL (ref 3.77–5.28)
RDW: 16.6 % — AB (ref 12.3–15.4)
WBC: 11.3 10*3/uL — ABNORMAL HIGH (ref 3.4–10.8)

## 2016-08-25 ENCOUNTER — Encounter: Payer: Medicaid Other | Admitting: Certified Nurse Midwife

## 2016-09-01 ENCOUNTER — Other Ambulatory Visit: Payer: Self-pay | Admitting: Certified Nurse Midwife

## 2016-09-01 ENCOUNTER — Ambulatory Visit (HOSPITAL_COMMUNITY)
Admission: RE | Admit: 2016-09-01 | Discharge: 2016-09-01 | Disposition: A | Discharge status: Home / Self Care | Payer: Medicaid Other | Source: Ambulatory Visit | Attending: Certified Nurse Midwife | Admitting: Certified Nurse Midwife

## 2016-09-01 ENCOUNTER — Ambulatory Visit (INDEPENDENT_AMBULATORY_CARE_PROVIDER_SITE_OTHER): Payer: Medicaid Other | Admitting: Certified Nurse Midwife

## 2016-09-01 DIAGNOSIS — Z3A26 26 weeks gestation of pregnancy: Secondary | ICD-10-CM

## 2016-09-01 DIAGNOSIS — Z3482 Encounter for supervision of other normal pregnancy, second trimester: Secondary | ICD-10-CM

## 2016-09-01 DIAGNOSIS — Z362 Encounter for other antenatal screening follow-up: Secondary | ICD-10-CM | POA: Diagnosis present

## 2016-09-01 DIAGNOSIS — Z3400 Encounter for supervision of normal first pregnancy, unspecified trimester: Secondary | ICD-10-CM

## 2016-09-01 DIAGNOSIS — Z3402 Encounter for supervision of normal first pregnancy, second trimester: Secondary | ICD-10-CM

## 2016-09-01 DIAGNOSIS — O4442 Low lying placenta NOS or without hemorrhage, second trimester: Secondary | ICD-10-CM | POA: Insufficient documentation

## 2016-09-01 NOTE — Patient Instructions (Addendum)
Third Trimester of Pregnancy The third trimester is from week 29 through week 40 (months 7 through 9). The third trimester is a time when the unborn baby (fetus) is growing rapidly. At the end of the ninth month, the fetus is about 20 inches in length and weighs 6-10 pounds. Body changes during your third trimester Your body goes through many changes during pregnancy. The changes vary from woman to woman. During the third trimester:  Your weight will continue to increase. You can expect to gain 25-35 pounds (11-16 kg) by the end of the pregnancy.  You may begin to get stretch marks on your hips, abdomen, and breasts.  You may urinate more often because the fetus is moving lower into your pelvis and pressing on your bladder.  You may develop or continue to have heartburn. This is caused by increased hormones that slow down muscles in the digestive tract.  You may develop or continue to have constipation because increased hormones slow digestion and cause the muscles that push waste through your intestines to relax.  You may develop hemorrhoids. These are swollen veins (varicose veins) in the rectum that can itch or be painful.  You may develop swollen, bulging veins (varicose veins) in your legs.  You may have increased body aches in the pelvis, back, or thighs. This is due to weight gain and increased hormones that are relaxing your joints.  You may have changes in your hair. These can include thickening of your hair, rapid growth, and changes in texture. Some women also have hair loss during or after pregnancy, or hair that feels dry or thin. Your hair will most likely return to normal after your baby is born.  Your breasts will continue to grow and they will continue to become tender. A yellow fluid (colostrum) may leak from your breasts. This is the first milk you are producing for your baby.  Your belly button may stick out.  You may notice more swelling in your hands, face, or  ankles.  You may have increased tingling or numbness in your hands, arms, and legs. The skin on your belly may also feel numb.  You may feel short of breath because of your expanding uterus.  You may have more problems sleeping. This can be caused by the size of your belly, increased need to urinate, and an increase in your body's metabolism.  You may notice the fetus "dropping," or moving lower in your abdomen.  You may have increased vaginal discharge.  Your cervix becomes thin and soft (effaced) near your due date. What to expect at prenatal visits You will have prenatal exams every 2 weeks until week 36. Then you will have weekly prenatal exams. During a routine prenatal visit:  You will be weighed to make sure you and the fetus are growing normally.  Your blood pressure will be taken.  Your abdomen will be measured to track your baby's growth.  The fetal heartbeat will be listened to.  Any test results from the previous visit will be discussed.  You may have a cervical check near your due date to see if you have effaced. At around 36 weeks, your health care provider will check your cervix. At the same time, your health care provider will also perform a test on the secretions of the vaginal tissue. This test is to determine if a type of bacteria, Group B streptococcus, is present. Your health care provider will explain this further. Your health care provider may ask you:    What your birth plan is.  How you are feeling.  If you are feeling the baby move.  If you have had any abnormal symptoms, such as leaking fluid, bleeding, severe headaches, or abdominal cramping.  If you are using any tobacco products, including cigarettes, chewing tobacco, and electronic cigarettes.  If you have any questions. Other tests or screenings that may be performed during your third trimester include:  Blood tests that check for low iron levels (anemia).  Fetal testing to check the health,  activity level, and growth of the fetus. Testing is done if you have certain medical conditions or if there are problems during the pregnancy.  Nonstress test (NST). This test checks the health of your baby to make sure there are no signs of problems, such as the baby not getting enough oxygen. During this test, a belt is placed around your belly. The baby is made to move, and its heart rate is monitored during movement. What is false labor? False labor is a condition in which you feel small, irregular tightenings of the muscles in the womb (contractions) that eventually go away. These are called Braxton Hicks contractions. Contractions may last for hours, days, or even weeks before true labor sets in. If contractions come at regular intervals, become more frequent, increase in intensity, or become painful, you should see your health care provider. What are the signs of labor?  Abdominal cramps.  Regular contractions that start at 10 minutes apart and become stronger and more frequent with time.  Contractions that start on the top of the uterus and spread down to the lower abdomen and back.  Increased pelvic pressure and dull back pain.  A watery or bloody mucus discharge that comes from the vagina.  Leaking of amniotic fluid. This is also known as your "water breaking." It could be a slow trickle or a gush. Let your doctor know if it has a color or strange odor. If you have any of these signs, call your health care provider right away, even if it is before your due date. Follow these instructions at home: Eating and drinking  Continue to eat regular, healthy meals.  Do not eat:  Raw meat or meat spreads.  Unpasteurized milk or cheese.  Unpasteurized juice.  Store-made salad.  Refrigerated smoked seafood.  Hot dogs or deli meat, unless they are piping hot.  More than 6 ounces of albacore tuna a week.  Shark, swordfish, king mackerel, or tile fish.  Store-made salads.  Raw  sprouts, such as mung bean or alfalfa sprouts.  Take prenatal vitamins as told by your health care provider.  Take 1000 mg of calcium daily as told by your health care provider.  If you develop constipation:  Take over-the-counter or prescription medicines.  Drink enough fluid to keep your urine clear or pale yellow.  Eat foods that are high in fiber, such as fresh fruits and vegetables, whole grains, and beans.  Limit foods that are high in fat and processed sugars, such as fried and sweet foods. Activity  Exercise only as directed by your health care provider. Healthy pregnant women should aim for 2 hours and 30 minutes of moderate exercise per week. If you experience any pain or discomfort while exercising, stop.  Avoid heavy lifting.  Do not exercise in extreme heat or humidity, or at high altitudes.  Wear low-heel, comfortable shoes.  Practice good posture.  Do not travel far distances unless it is absolutely necessary and only with the approval   of your health care provider.  Wear your seat belt at all times while in a car, on a bus, or on a plane.  Take frequent breaks and rest with your legs elevated if you have leg cramps or low back pain.  Do not use hot tubs, steam rooms, or saunas.  You may continue to have sex unless your health care provider tells you otherwise. Lifestyle  Do not use any products that contain nicotine or tobacco, such as cigarettes and e-cigarettes. If you need help quitting, ask your health care provider.  Do not drink alcohol.  Do not use any medicinal herbs or unprescribed drugs. These chemicals affect the formation and growth of the baby.  If you develop varicose veins:  Wear support pantyhose or compression stockings as told by your healthcare provider.  Elevate your feet for 15 minutes, 3-4 times a day.  Wear a supportive maternity bra to help with breast tenderness. General instructions  Take over-the-counter and prescription  medicines only as told by your health care provider. There are medicines that are either safe or unsafe to take during pregnancy.  Take warm sitz baths to soothe any pain or discomfort caused by hemorrhoids. Use hemorrhoid cream or witch hazel if your health care provider approves.  Avoid cat litter boxes and soil used by cats. These carry germs that can cause birth defects in the baby. If you have a cat, ask someone to clean the litter box for you.  To prepare for the arrival of your baby:  Take prenatal classes to understand, practice, and ask questions about the labor and delivery.  Make a trial run to the hospital.  Visit the hospital and tour the maternity area.  Arrange for maternity or paternity leave through employers.  Arrange for family and friends to take care of pets while you are in the hospital.  Purchase a rear-facing car seat and make sure you know how to install it in your car.  Pack your hospital bag.  Prepare the baby's nursery. Make sure to remove all pillows and stuffed animals from the baby's crib to prevent suffocation.  Visit your dentist if you have not gone during your pregnancy. Use a soft toothbrush to brush your teeth and be gentle when you floss.  Keep all prenatal follow-up visits as told by your health care provider. This is important. Contact a health care provider if:  You are unsure if you are in labor or if your water has broken.  You become dizzy.  You have mild pelvic cramps, pelvic pressure, or nagging pain in your abdominal area.  You have lower back pain.  You have persistent nausea, vomiting, or diarrhea.  You have an unusual or bad smelling vaginal discharge.  You have pain when you urinate. Get help right away if:  You have a fever.  You are leaking fluid from your vagina.  You have spotting or bleeding from your vagina.  You have severe abdominal pain or cramping.  You have rapid weight loss or weight gain.  You have  shortness of breath with chest pain.  You notice sudden or extreme swelling of your face, hands, ankles, feet, or legs.  Your baby makes fewer than 10 movements in 2 hours.  You have severe headaches that do not go away with medicine.  You have vision changes. Summary  The third trimester is from week 29 through week 40, months 7 through 9. The third trimester is a time when the unborn baby (fetus)   is growing rapidly.  During the third trimester, your discomfort may increase as you and your baby continue to gain weight. You may have abdominal, leg, and back pain, sleeping problems, and an increased need to urinate.  During the third trimester your breasts will keep growing and they will continue to become tender. A yellow fluid (colostrum) may leak from your breasts. This is the first milk you are producing for your baby.  False labor is a condition in which you feel small, irregular tightenings of the muscles in the womb (contractions) that eventually go away. These are called Braxton Hicks contractions. Contractions may last for hours, days, or even weeks before true labor sets in.  Signs of labor can include: abdominal cramps; regular contractions that start at 10 minutes apart and become stronger and more frequent with time; watery or bloody mucus discharge that comes from the vagina; increased pelvic pressure and dull back pain; and leaking of amniotic fluid. This information is not intended to replace advice given to you by your health care provider. Make sure you discuss any questions you have with your health care provider. Document Released: 08/03/2001 Document Revised: 01/15/2016 Document Reviewed: 10/10/2012 Elsevier Interactive Patient Education  2017 [employer]  

## 2016-09-01 NOTE — Progress Notes (Signed)
   PRENATAL VISIT NOTE  Subjective:  Jill Madden is a 21 y.o. G2P0010 at 4714w4d being seen today for ongoing prenatal care.  She is currently monitored for the following issues for this low-risk pregnancy and has Supervision of normal first pregnancy, antepartum on her problem list.  Patient reports no complaints.  Contractions: Irritability. Vag. Bleeding: None.  Movement: Present. Denies leaking of fluid.   The following portions of the patient's history were reviewed and updated as appropriate: allergies, current medications, past family history, past medical history, past social history, past surgical history and problem list. Problem list updated.  Objective:   Vitals:   09/01/16 0904  BP: 126/85  Pulse: (!) 103  Temp: 99.5 F (37.5 C)  Weight: 27 lb (12.2 kg)    Fetal Status: Fetal Heart Rate (bpm): 143 Fundal Height: 26 cm Movement: Present     General:  Alert, oriented and cooperative. Patient is in no acute distress.  Skin: Skin is warm and dry. No rash noted.   Cardiovascular: Normal heart rate noted  Respiratory: Normal respiratory effort, no problems with respiration noted  Abdomen: Soft, gravid, appropriate for gestational age. Pain/Pressure: Present     Pelvic:  Cervical exam deferred        Extremities: Normal range of motion.  Edema: Trace  Mental Status: Normal mood and affect. Normal behavior. Normal judgment and thought content.   Assessment and Plan:  Pregnancy: G2P0010 at 6614w4d  1. Supervision of normal first pregnancy, antepartum  2 hour OGTT next ROB.  Has f/u US today for low lying placenta.  Preterm labor symptoms and general obstetric precautions including but not limited to vaginal bleeding, contractions, leaking of fluid and fetal movement were reviewed in detail with the patient. Please refer to After Visit Summary for other counseling recommendations.  Return in about 2 weeks (around 09/15/2016) for ROB/2hour OGTT.   Roe Coombsachelle A Mattingly Fountaine, CNM

## 2016-09-06 ENCOUNTER — Other Ambulatory Visit: Payer: Self-pay | Admitting: Certified Nurse Midwife

## 2016-09-06 DIAGNOSIS — Z3400 Encounter for supervision of normal first pregnancy, unspecified trimester: Secondary | ICD-10-CM

## 2016-09-15 ENCOUNTER — Other Ambulatory Visit: Payer: Medicaid Other

## 2016-09-15 ENCOUNTER — Ambulatory Visit (INDEPENDENT_AMBULATORY_CARE_PROVIDER_SITE_OTHER): Payer: Medicaid Other | Admitting: Advanced Practice Midwife

## 2016-09-15 DIAGNOSIS — O10919 Unspecified pre-existing hypertension complicating pregnancy, unspecified trimester: Secondary | ICD-10-CM | POA: Insufficient documentation

## 2016-09-15 DIAGNOSIS — Z3400 Encounter for supervision of normal first pregnancy, unspecified trimester: Secondary | ICD-10-CM

## 2016-09-15 DIAGNOSIS — Z23 Encounter for immunization: Secondary | ICD-10-CM | POA: Diagnosis not present

## 2016-09-15 DIAGNOSIS — O10913 Unspecified pre-existing hypertension complicating pregnancy, third trimester: Secondary | ICD-10-CM

## 2016-09-15 MED ORDER — TETANUS-DIPHTH-ACELL PERTUSSIS 5-2.5-18.5 LF-MCG/0.5 IM SUSP
0.5000 mL | Freq: Once | INTRAMUSCULAR | Status: AC
  Administered 2016-09-15: 0.5 mL via INTRAMUSCULAR

## 2016-09-15 MED ORDER — ASPIRIN EC 81 MG PO TBEC: 81.0000 mg | DELAYED_RELEASE_TABLET | Freq: Every day | ORAL | 2 refills | Status: DC

## 2016-09-15 NOTE — Progress Notes (Signed)
   PRENATAL VISIT NOTE  Subjective:  Jill Madden is a 21 y.o. G2P0010 at 727w4d being seen today for ongoing prenatal care.  She is currently monitored for the following issues for this high-risk pregnancy and has Supervision of normal first pregnancy, antepartum and Chronic hypertension during pregnancy on her problem list.  Patient reports no complaints.  Contractions: Irregular. Vag. Bleeding: None.  Movement: Present. Denies leaking of fluid.   The following portions of the patient's history were reviewed and updated as appropriate: allergies, current medications, past family history, past medical history, past social history, past surgical history and problem list. Problem list updated.  Objective:   Vitals:   09/15/16 0909  BP: 132/90  Pulse: (!) 101  Temp: 98.8 F (37.1 C)  Weight: 231 lb (104.8 kg)    Fetal Status: Fetal Heart Rate (bpm): 145 Fundal Height: 28 cm Movement: Present     General:  Alert, oriented and cooperative. Patient is in no acute distress.  Skin: Skin is warm and dry. No rash noted.   Cardiovascular: Normal heart rate noted  Respiratory: Normal respiratory effort, no problems with respiration noted  Abdomen: Soft, gravid, appropriate for gestational age. Pain/Pressure: Present     Pelvic:  Cervical exam deferred        Extremities: Normal range of motion.  Edema: Trace  Mental Status: Normal mood and affect. Normal behavior. Normal judgment and thought content.   Assessment and Plan:  Pregnancy: G2P0010 at 6427w4d  1. Supervision of normal first pregnancy, antepartum  - Glucose Tolerance, 2 Hours w/1 Hour - CBC - HIV antibody - RPR  2. Encounter for immunization  - Flu Vaccine QUAD 36+ mos IM - Tdap (BOOSTRIX) injection 0.5 mL; Inject 0.5 mLs into the muscle once.   3. Chronic hypertension during pregnancy --BP borderline, pt denies symptoms.  Preeclampsia precautions given. --Recent US on 1/10 done to complete anatomy.  Needs US at 32  weeks for growth. --Start ASA 81 mg daily --Add CMP and P/C ratio to 28 week labs to complete preeclampsia evaluation.  See baseline labs done 07/2016. - Comprehensive metabolic panel - Protein / creatinine ratio, urine  Preterm labor symptoms and general obstetric precautions including but not limited to vaginal bleeding, contractions, leaking of fluid and fetal movement were reviewed in detail with the patient. Please refer to After Visit Summary for other counseling recommendations.  Return in about 2 weeks (around 09/29/2016).   Hurshel PartyLisa A Leftwich-Kirby, CNM

## 2016-09-15 NOTE — Patient Instructions (Signed)
Third Trimester of Pregnancy The third trimester is from week 29 through week 40 (months 7 through 9). The third trimester is a time when the unborn baby (fetus) is growing rapidly. At the end of the ninth month, the fetus is about 20 inches in length and weighs 6-10 pounds. Body changes during your third trimester Your body goes through many changes during pregnancy. The changes vary from woman to woman. During the third trimester:  Your weight will continue to increase. You can expect to gain 25-35 pounds (11-16 kg) by the end of the pregnancy.  You may begin to get stretch marks on your hips, abdomen, and breasts.  You may urinate more often because the fetus is moving lower into your pelvis and pressing on your bladder.  You may develop or continue to have heartburn. This is caused by increased hormones that slow down muscles in the digestive tract.  You may develop or continue to have constipation because increased hormones slow digestion and cause the muscles that push waste through your intestines to relax.  You may develop hemorrhoids. These are swollen veins (varicose veins) in the rectum that can itch or be painful.  You may develop swollen, bulging veins (varicose veins) in your legs.  You may have increased body aches in the pelvis, back, or thighs. This is due to weight gain and increased hormones that are relaxing your joints.  You may have changes in your hair. These can include thickening of your hair, rapid growth, and changes in texture. Some women also have hair loss during or after pregnancy, or hair that feels dry or thin. Your hair will most likely return to normal after your baby is born.  Your breasts will continue to grow and they will continue to become tender. A yellow fluid (colostrum) may leak from your breasts. This is the first milk you are producing for your baby.  Your belly button may stick out.  You may notice more swelling in your hands, face, or  ankles.  You may have increased tingling or numbness in your hands, arms, and legs. The skin on your belly may also feel numb.  You may feel short of breath because of your expanding uterus.  You may have more problems sleeping. This can be caused by the size of your belly, increased need to urinate, and an increase in your body's metabolism.  You may notice the fetus "dropping," or moving lower in your abdomen.  You may have increased vaginal discharge.  Your cervix becomes thin and soft (effaced) near your due date. What to expect at prenatal visits You will have prenatal exams every 2 weeks until week 36. Then you will have weekly prenatal exams. During a routine prenatal visit:  You will be weighed to make sure you and the fetus are growing normally.  Your blood pressure will be taken.  Your abdomen will be measured to track your baby's growth.  The fetal heartbeat will be listened to.  Any test results from the previous visit will be discussed.  You may have a cervical check near your due date to see if you have effaced. At around 36 weeks, your health care provider will check your cervix. At the same time, your health care provider will also perform a test on the secretions of the vaginal tissue. This test is to determine if a type of bacteria, Group B streptococcus, is present. Your health care provider will explain this further. Your health care provider may ask you:    What your birth plan is.  How you are feeling.  If you are feeling the baby move.  If you have had any abnormal symptoms, such as leaking fluid, bleeding, severe headaches, or abdominal cramping.  If you are using any tobacco products, including cigarettes, chewing tobacco, and electronic cigarettes.  If you have any questions. Other tests or screenings that may be performed during your third trimester include:  Blood tests that check for low iron levels (anemia).  Fetal testing to check the health,  activity level, and growth of the fetus. Testing is done if you have certain medical conditions or if there are problems during the pregnancy.  Nonstress test (NST). This test checks the health of your baby to make sure there are no signs of problems, such as the baby not getting enough oxygen. During this test, a belt is placed around your belly. The baby is made to move, and its heart rate is monitored during movement. What is false labor? False labor is a condition in which you feel small, irregular tightenings of the muscles in the womb (contractions) that eventually go away. These are called Braxton Hicks contractions. Contractions may last for hours, days, or even weeks before true labor sets in. If contractions come at regular intervals, become more frequent, increase in intensity, or become painful, you should see your health care provider. What are the signs of labor?  Abdominal cramps.  Regular contractions that start at 10 minutes apart and become stronger and more frequent with time.  Contractions that start on the top of the uterus and spread down to the lower abdomen and back.  Increased pelvic pressure and dull back pain.  A watery or bloody mucus discharge that comes from the vagina.  Leaking of amniotic fluid. This is also known as your "water breaking." It could be a slow trickle or a gush. Let your doctor know if it has a color or strange odor. If you have any of these signs, call your health care provider right away, even if it is before your due date. Follow these instructions at home: Eating and drinking  Continue to eat regular, healthy meals.  Do not eat:  Raw meat or meat spreads.  Unpasteurized milk or cheese.  Unpasteurized juice.  Store-made salad.  Refrigerated smoked seafood.  Hot dogs or deli meat, unless they are piping hot.  More than 6 ounces of albacore tuna a week.  Shark, swordfish, king mackerel, or tile fish.  Store-made salads.  Raw  sprouts, such as mung bean or alfalfa sprouts.  Take prenatal vitamins as told by your health care provider.  Take 1000 mg of calcium daily as told by your health care provider.  If you develop constipation:  Take over-the-counter or prescription medicines.  Drink enough fluid to keep your urine clear or pale yellow.  Eat foods that are high in fiber, such as fresh fruits and vegetables, whole grains, and beans.  Limit foods that are high in fat and processed sugars, such as fried and sweet foods. Activity  Exercise only as directed by your health care provider. Healthy pregnant women should aim for 2 hours and 30 minutes of moderate exercise per week. If you experience any pain or discomfort while exercising, stop.  Avoid heavy lifting.  Do not exercise in extreme heat or humidity, or at high altitudes.  Wear low-heel, comfortable shoes.  Practice good posture.  Do not travel far distances unless it is absolutely necessary and only with the approval   of your health care provider.  Wear your seat belt at all times while in a car, on a bus, or on a plane.  Take frequent breaks and rest with your legs elevated if you have leg cramps or low back pain.  Do not use hot tubs, steam rooms, or saunas.  You may continue to have sex unless your health care provider tells you otherwise. Lifestyle  Do not use any products that contain nicotine or tobacco, such as cigarettes and e-cigarettes. If you need help quitting, ask your health care provider.  Do not drink alcohol.  Do not use any medicinal herbs or unprescribed drugs. These chemicals affect the formation and growth of the baby.  If you develop varicose veins:  Wear support pantyhose or compression stockings as told by your healthcare provider.  Elevate your feet for 15 minutes, 3-4 times a day.  Wear a supportive maternity bra to help with breast tenderness. General instructions  Take over-the-counter and prescription  medicines only as told by your health care provider. There are medicines that are either safe or unsafe to take during pregnancy.  Take warm sitz baths to soothe any pain or discomfort caused by hemorrhoids. Use hemorrhoid cream or witch hazel if your health care provider approves.  Avoid cat litter boxes and soil used by cats. These carry germs that can cause birth defects in the baby. If you have a cat, ask someone to clean the litter box for you.  To prepare for the arrival of your baby:  Take prenatal classes to understand, practice, and ask questions about the labor and delivery.  Make a trial run to the hospital.  Visit the hospital and tour the maternity area.  Arrange for maternity or paternity leave through employers.  Arrange for family and friends to take care of pets while you are in the hospital.  Purchase a rear-facing car seat and make sure you know how to install it in your car.  Pack your hospital bag.  Prepare the baby's nursery. Make sure to remove all pillows and stuffed animals from the baby's crib to prevent suffocation.  Visit your dentist if you have not gone during your pregnancy. Use a soft toothbrush to brush your teeth and be gentle when you floss.  Keep all prenatal follow-up visits as told by your health care provider. This is important. Contact a health care provider if:  You are unsure if you are in labor or if your water has broken.  You become dizzy.  You have mild pelvic cramps, pelvic pressure, or nagging pain in your abdominal area.  You have lower back pain.  You have persistent nausea, vomiting, or diarrhea.  You have an unusual or bad smelling vaginal discharge.  You have pain when you urinate. Get help right away if:  You have a fever.  You are leaking fluid from your vagina.  You have spotting or bleeding from your vagina.  You have severe abdominal pain or cramping.  You have rapid weight loss or weight gain.  You have  shortness of breath with chest pain.  You notice sudden or extreme swelling of your face, hands, ankles, feet, or legs.  Your baby makes fewer than 10 movements in 2 hours.  You have severe headaches that do not go away with medicine.  You have vision changes. Summary  The third trimester is from week 29 through week 40, months 7 through 9. The third trimester is a time when the unborn baby (fetus)   is growing rapidly.  During the third trimester, your discomfort may increase as you and your baby continue to gain weight. You may have abdominal, leg, and back pain, sleeping problems, and an increased need to urinate.  During the third trimester your breasts will keep growing and they will continue to become tender. A yellow fluid (colostrum) may leak from your breasts. This is the first milk you are producing for your baby.  False labor is a condition in which you feel small, irregular tightenings of the muscles in the womb (contractions) that eventually go away. These are called Braxton Hicks contractions. Contractions may last for hours, days, or even weeks before true labor sets in.  Signs of labor can include: abdominal cramps; regular contractions that start at 10 minutes apart and become stronger and more frequent with time; watery or bloody mucus discharge that comes from the vagina; increased pelvic pressure and dull back pain; and leaking of amniotic fluid. This information is not intended to replace advice given to you by your health care provider. Make sure you discuss any questions you have with your health care provider. Document Released: 08/03/2001 Document Revised: 01/15/2016 Document Reviewed: 10/10/2012 Elsevier Interactive Patient Education  2017 [employer]  

## 2016-09-16 LAB — COMPREHENSIVE METABOLIC PANEL
ALBUMIN: 3.7 g/dL (ref 3.5–5.5)
ALT: 16 IU/L (ref 0–32)
AST: 9 IU/L (ref 0–40)
Albumin/Globulin Ratio: 1.4 (ref 1.2–2.2)
Alkaline Phosphatase: 108 IU/L (ref 39–117)
BILIRUBIN TOTAL: 0.2 mg/dL (ref 0.0–1.2)
BUN/Creatinine Ratio: 13 (ref 9–23)
BUN: 8 mg/dL (ref 6–20)
CHLORIDE: 102 mmol/L (ref 96–106)
CO2: 21 mmol/L (ref 18–29)
CREATININE: 0.6 mg/dL (ref 0.57–1.00)
Calcium: 9.2 mg/dL (ref 8.7–10.2)
GFR calc non Af Amer: 130 mL/min/{1.73_m2} (ref >59)
GFR, EST AFRICAN AMERICAN: 150 mL/min/{1.73_m2} (ref >59)
GLUCOSE: 88 mg/dL (ref 65–99)
Globulin, Total: 2.7 g/dL (ref 1.5–4.5)
Potassium: 4.3 mmol/L (ref 3.5–5.2)
Sodium: 141 mmol/L (ref 134–144)
TOTAL PROTEIN: 6.4 g/dL (ref 6.0–8.5)

## 2016-09-16 LAB — CBC
HEMOGLOBIN: 11 g/dL — AB (ref 11.1–15.9)
Hematocrit: 34.8 % (ref 34.0–46.6)
MCH: 28.1 pg (ref 26.6–33.0)
MCHC: 31.6 g/dL (ref 31.5–35.7)
MCV: 89 fL (ref 79–97)
Platelets: 297 10*3/uL (ref 150–379)
RBC: 3.92 x10E6/uL (ref 3.77–5.28)
RDW: 16.2 % — ABNORMAL HIGH (ref 12.3–15.4)
WBC: 11.4 10*3/uL — AB (ref 3.4–10.8)

## 2016-09-16 LAB — PROTEIN / CREATININE RATIO, URINE
CREATININE, UR: 109 mg/dL
PROTEIN/CREAT RATIO: 272 mg/g{creat} — AB (ref 0–200)
Protein, Ur: 29.7 mg/dL

## 2016-09-16 LAB — GLUCOSE TOLERANCE, 2 HOURS W/ 1HR
GLUCOSE, 1 HOUR: 190 mg/dL — AB (ref 65–179)
GLUCOSE, 2 HOUR: 145 mg/dL (ref 65–152)
Glucose, Fasting: 94 mg/dL — ABNORMAL HIGH (ref 65–91)

## 2016-09-16 LAB — HIV ANTIBODY (ROUTINE TESTING W REFLEX): HIV SCREEN 4TH GENERATION: NONREACTIVE

## 2016-09-16 LAB — RPR: RPR: NONREACTIVE

## 2016-09-17 ENCOUNTER — Other Ambulatory Visit: Payer: Self-pay | Admitting: Certified Nurse Midwife

## 2016-09-17 ENCOUNTER — Encounter: Payer: Self-pay | Admitting: Certified Nurse Midwife

## 2016-09-17 DIAGNOSIS — O10919 Unspecified pre-existing hypertension complicating pregnancy, unspecified trimester: Secondary | ICD-10-CM

## 2016-09-17 DIAGNOSIS — O24419 Gestational diabetes mellitus in pregnancy, unspecified control: Secondary | ICD-10-CM

## 2016-09-17 DIAGNOSIS — O0990 Supervision of high risk pregnancy, unspecified, unspecified trimester: Secondary | ICD-10-CM

## 2016-09-20 ENCOUNTER — Telehealth: Payer: Self-pay | Admitting: *Deleted

## 2016-09-20 DIAGNOSIS — O24419 Gestational diabetes mellitus in pregnancy, unspecified control: Secondary | ICD-10-CM

## 2016-09-20 MED ORDER — ACCU-CHEK FASTCLIX LANCETS MISC: 1.0000 [IU] | Freq: Four times a day (QID) | 12 refills | Status: DC

## 2016-09-20 MED ORDER — GLUCOSE BLOOD VI STRP: ORAL_STRIP | 12 refills | Status: DC

## 2016-09-20 MED ORDER — ACCU-CHEK NANO SMARTVIEW W/DEVICE KIT: 1.0000 | PACK | 0 refills | Status: DC

## 2016-09-20 NOTE — Telephone Encounter (Signed)
-----   Message from Roe Coombsachelle A Denney, CNM sent at 09/17/2016  9:16 PM EST ----- Please order her glucometer, test strips and lancets.  Please let her know that she does have GDM.  I have ordered testing/diabetic teaching.  Thank you.  R.Denney CNM

## 2016-09-20 NOTE — Telephone Encounter (Signed)
Ordered Glucometer and transferred pt to referral coordinator to sch N&D appt

## 2016-09-24 ENCOUNTER — Other Ambulatory Visit: Payer: Self-pay

## 2016-09-24 MED ORDER — DOXYLAMINE-PYRIDOXINE 10-10 MG PO TBEC: 2.0000 | DELAYED_RELEASE_TABLET | Freq: Every day | ORAL | 5 refills | Status: DC

## 2016-09-24 NOTE — Progress Notes (Signed)
TC from pt c/o N&V today and swelling in feet. She has not tried any N&V medication. Pt has hx of CHTN, denies visual disturbances and Ha's. Pt encouraged to increase water intake at least 64 oz daily and to elevate feet. Diclegis sent per protocol and pt informed if sx's worsen, report to [facility].

## 2016-09-25 ENCOUNTER — Emergency Department (HOSPITAL_COMMUNITY)
Admission: EM | Admit: 2016-09-25 | Discharge: 2016-09-26 | Disposition: A | Discharge status: Home / Self Care | Payer: Medicaid Other | Attending: Emergency Medicine | Admitting: Emergency Medicine

## 2016-09-25 ENCOUNTER — Encounter (HOSPITAL_COMMUNITY): Payer: Self-pay

## 2016-09-25 DIAGNOSIS — R197 Diarrhea, unspecified: Secondary | ICD-10-CM | POA: Insufficient documentation

## 2016-09-25 DIAGNOSIS — O219 Vomiting of pregnancy, unspecified: Secondary | ICD-10-CM | POA: Diagnosis present

## 2016-09-25 DIAGNOSIS — I10 Essential (primary) hypertension: Secondary | ICD-10-CM | POA: Diagnosis not present

## 2016-09-25 DIAGNOSIS — Z3A30 30 weeks gestation of pregnancy: Secondary | ICD-10-CM | POA: Diagnosis not present

## 2016-09-25 DIAGNOSIS — Z87891 Personal history of nicotine dependence: Secondary | ICD-10-CM | POA: Insufficient documentation

## 2016-09-25 DIAGNOSIS — Z7982 Long term (current) use of aspirin: Secondary | ICD-10-CM | POA: Insufficient documentation

## 2016-09-25 DIAGNOSIS — R112 Nausea with vomiting, unspecified: Secondary | ICD-10-CM

## 2016-09-25 NOTE — ED Triage Notes (Signed)
G2P0, TAB 1, EDC 12-04-16, [redacted] weeks pregnant. Complications with this pregnancy, HTN, glucose test came back abnormal, has appt with nutrition next week.  No leaking fluid, vaginal bleeding, + FM.  Onset today has had contractions- x 5 today.  Onset yesterday diarrhea, x 10 today-watery, green, onset today fever, vomiting x 1, mild cough.  No respiratory difficulties.

## 2016-09-25 NOTE — ED Provider Notes (Signed)
[facility] DEPT Provider Note   CSN: [id number] Arrival date & time: 09/25/16  2129  By signing my name below, I, Oleh Genin, attest that this documentation has been prepared under the direction and in the presence of Fatima Blank, MD. Electronically Signed: Oleh Genin, Scribe. 09/25/16. 11:36 PM.   History   Chief Complaint No chief complaint on file.   HPI Jill Madden is a 21 y.o. female who is G2P0 at 30wks that presents to the ED for evaluation of diarrhea which began yesterday at 1200. This patient states that she is experiencing "over 10 instances" of diarrhea per day. She also developed nausea this afternoon and has vomited twice following meals. However she denies any objective fever. Denies any dysuria, vaginal discharge, and vaginal bleeding. She is otherwise reporting occasional lower abdominal/lower back discomfort that is related to her diarrheal episodes. Denies any rhinorrhea, congestion, and cough. She is tolerating fluids.   Her OB has already Rx'd her nausea medication that she has not picked up yet.  The history is provided by the patient. No language interpreter was used.    Past Medical History:  Diagnosis Date  . Hypertension    adolescent  . Pyelonephritis     Patient Active Problem List   Diagnosis Date Noted  . GDM (gestational diabetes mellitus) 09/17/2016  . Chronic hypertension during pregnancy 09/15/2016  . Supervision of high risk pregnancy, antepartum 06/09/2016    History reviewed. No pertinent surgical history.  OB History    Gravida Para Term Preterm AB Living   2       1     SAB TAB Ectopic Multiple Live Births     1             Home Medications    Prior to Admission medications   Medication Sig Start Date End Date Taking? Authorizing Provider  ACCU-CHEK FASTCLIX LANCETS MISC 1 Units by Percutaneous route 4 (four) times daily. 09/20/16  Yes Rachelle A Denney, CNM  aspirin EC 81 MG tablet Take 1 tablet (81  mg total) by mouth daily. 09/15/16  Yes Lisa A Leftwich-Kirby, CNM  Blood Glucose Monitoring Suppl (ACCU-CHEK NANO SMARTVIEW) w/Device KIT 1 kit by Subdermal route as directed. Check blood sugars for fasting, and two hours after breakfast, lunch and dinner (4 checks daily) 09/20/16  Yes Rachelle A Denney, CNM  butalbital-acetaminophen-caffeine (FIORICET, ESGIC) 50-325-40 MG tablet Take 1-2 tablets by mouth every 6 (six) hours as needed for headache. 07/07/16  Yes Rachelle A Denney, CNM  glucose blood (ACCU-CHEK SMARTVIEW) test strip Use as instructed to check blood sugars 09/20/16  Yes Rachelle A Denney, CNM  Prenatal Vit-Fe Fumarate-FA (MULTIVITAMIN-PRENATAL) 27-0.8 MG TABS tablet Take 1 tablet by mouth daily at 12 noon.   Yes Historical Provider, MD  Doxylamine-Pyridoxine (DICLEGIS) 10-10 MG TBEC Take 2 tablets by mouth at bedtime. If symptoms persist, add one tablet in the morning and one in the afternoon Patient not taking: Reported on 09/25/2016 09/24/16   Shelly Bombard, MD    Family History Family History  Problem Relation Age of Onset  . Diabetes Sister   . Diabetes Maternal Grandmother     Social History Social History  Substance Use Topics  . Smoking status: Former Smoker    Packs/day: 0.50    Types: Cigars  . Smokeless tobacco: Never Used  . Alcohol use No     Allergies   Patient has no known allergies.   Review of Systems Review of Systems 10  systems reviewed and all are negative for acute change except as noted in the HPI.  Physical Exam Updated Vital Signs BP 142/91 (BP Location: Left Arm)   Pulse 107   Temp 99 F (37.2 C) (Oral)   Resp 18   LMP 02/28/2016 (Exact Date)   SpO2 97%   Physical Exam  Constitutional: She is oriented to person, place, and time. She appears well-developed and well-nourished. No distress.  HENT:  Head: Normocephalic and atraumatic.  Nose: Nose normal.  Mouth/Throat: Oropharynx is clear and moist.  Eyes: Conjunctivae and EOM are  normal. Pupils are equal, round, and reactive to light. Right eye exhibits no discharge. Left eye exhibits no discharge. No scleral icterus.  Neck: Normal range of motion. Neck supple.  Cardiovascular: Normal rate, regular rhythm and normal heart sounds.  Exam reveals no gallop and no friction rub.   No murmur heard. Pulmonary/Chest: Effort normal and breath sounds normal. No stridor. No respiratory distress. She has no wheezes. She has no rales.  Abdominal: Soft. She exhibits no distension. There is no tenderness. There is no rebound and no guarding.  gravid  Musculoskeletal: Normal range of motion. She exhibits no edema or tenderness.  Neurological: She is alert and oriented to person, place, and time.  Skin: Skin is warm and dry. No rash noted. She is not diaphoretic. No erythema.  Psychiatric: She has a normal mood and affect.  Vitals reviewed.       ED Treatments / Results  DIAGNOSTIC STUDIES: Oxygen Saturation is 97 percent on room air which is normal by my interpretation.    COORDINATION OF CARE: 11:34 PM Discussed treatment plan with pt at bedside and pt agreed to plan.  Labs (all labs ordered are listed, but only abnormal results are displayed) Labs Reviewed - No data to display  EKG  EKG Interpretation None       Radiology No results found.  Procedures Procedures (including critical care time) EMERGENCY DEPARTMENT [place] PREGNANCY "Study: Limited Ultrasound of the Pelvis for Pregnancy"  INDICATIONS:Pregnancy(required) and Abdominal or pelvic pain Multiple views of the uterus and pelvic cavity were obtained in real-time with a multi-frequency probe.  APPROACH:Transabdominal   PERFORMED BY: Myself  IMAGES ARCHIVED?: Yes  LIMITATIONS: none  PREGNANCY FINDINGS: active fetus  INTERPRETATION: viable IUP  FETAL HEART RATE: 133  CPT Codes:  [phone] (transabdominal OB)     Medications Ordered in ED Medications - No data to display   Initial Impression  / Assessment and Plan / ED Course  I have reviewed the triage vital signs and the nursing notes.  Pertinent labs & imaging results that were available during my care of the patient were reviewed by me and considered in my medical decision making (see chart for details).     21 y.o. female presents with vomiting, diarrhea for 2 days. adequate oral tolerance. Rest of history as above.  Patient appears well, not in distress, and with no signs of toxicity or dehydration. Abdomen benign. Rest of the exam as above  Most consistent with viral gastroenteritis vs food poisoning.   Doubt appendicitis, diverticulitis, severe colitis, dysentery.    Able to tolerate oral intake in the ED. Good FHT.  Discussed symptomatic treatment with the parents and they will follow closely with their PCP.   Final Clinical Impressions(s) / ED Diagnoses   Final diagnoses:  Nausea vomiting and diarrhea   Disposition: Discharge  Condition: Good  I have discussed the results, Dx and Tx plan with the patient  who expressed understanding and agree(s) with the plan. Discharge instructions discussed at great length. The patient was given strict return precautions who verbalized understanding of the instructions. No further questions at time of discharge.    New Prescriptions   No medications on file    Follow Up: primary care provider  Call  in 5-7 days, If symptoms do not improve or  worsen  I personally performed the services described in this documentation, which was scribed in my presence. The recorded information has been reviewed and is accurate.         Fatima Blank, MD 09/25/16 [phone]

## 2016-09-26 NOTE — ED Notes (Signed)
Pt and family understood dc material. NAD noted 

## 2016-09-29 ENCOUNTER — Ambulatory Visit (INDEPENDENT_AMBULATORY_CARE_PROVIDER_SITE_OTHER): Payer: Medicaid Other | Admitting: Certified Nurse Midwife

## 2016-09-29 VITALS — BP 128/84 | HR 90 | Wt 228.0 lb

## 2016-09-29 DIAGNOSIS — O24419 Gestational diabetes mellitus in pregnancy, unspecified control: Secondary | ICD-10-CM

## 2016-09-29 DIAGNOSIS — O0990 Supervision of high risk pregnancy, unspecified, unspecified trimester: Secondary | ICD-10-CM

## 2016-09-29 DIAGNOSIS — O10919 Unspecified pre-existing hypertension complicating pregnancy, unspecified trimester: Secondary | ICD-10-CM

## 2016-09-29 DIAGNOSIS — O10913 Unspecified pre-existing hypertension complicating pregnancy, third trimester: Secondary | ICD-10-CM

## 2016-09-29 NOTE — Progress Notes (Signed)
   PRENATAL VISIT NOTE  Subjective:  Jill Madden is a 21 y.o. G2P0010 at 6768w4d being seen today for ongoing prenatal care.  She is currently monitored for the following issues for this high-risk pregnancy and has Supervision of high risk pregnancy, antepartum; Chronic hypertension during pregnancy; and GDM (gestational diabetes mellitus) on her problem list.  Patient reports no complaints.  Contractions: Not present. Vag. Bleeding: None.  Movement: Present. Denies leaking of fluid.   The following portions of the patient's history were reviewed and updated as appropriate: allergies, current medications, past family history, past medical history, past social history, past surgical history and problem list. Problem list updated.  Objective:   Vitals:   09/29/16 0856  BP: 128/84  Pulse: 90  Weight: 228 lb (103.4 kg)    Fetal Status: Fetal Heart Rate (bpm): 143 Fundal Height: 30 cm Movement: Present     General:  Alert, oriented and cooperative. Patient is in no acute distress.  Skin: Skin is warm and dry. No rash noted.   Cardiovascular: Normal heart rate noted  Respiratory: Normal respiratory effort, no problems with respiration noted  Abdomen: Soft, gravid, appropriate for gestational age. Pain/Pressure: Present     Pelvic:  Cervical exam deferred        Extremities: Normal range of motion.     Mental Status: Normal mood and affect. Normal behavior. Normal judgment and thought content.   Assessment and Plan:  Pregnancy: G2P0010 at 2968w4d  1. Supervision of high risk pregnancy, antepartum     GDM, CHTN  2. Chronic hypertension during pregnancy     Normotensive, not on medications  3. Gestational diabetes mellitus (GDM) in third trimester, gestational diabetes method of control unspecified     Has DM teaching tomorrow, has supplies: lancets, meter and test strips.    Preterm labor symptoms and general obstetric precautions including but not limited to vaginal bleeding,  contractions, leaking of fluid and fetal movement were reviewed in detail with the patient. Please refer to After Visit Summary for other counseling recommendations.  [facility]around [facility]2016[facility]   Roe Coombsachelle A Idelle Reimann, CNM

## 2016-09-29 NOTE — Progress Notes (Signed)
Pt has appt with Diabetes education tomorrow.

## 2016-09-30 ENCOUNTER — Ambulatory Visit (HOSPITAL_COMMUNITY)
Admission: RE | Admit: 2016-09-30 | Discharge: 2016-09-30 | Disposition: A | Discharge status: Home / Self Care | Payer: Medicaid Other | Source: Ambulatory Visit | Attending: Certified Nurse Midwife | Admitting: Certified Nurse Midwife

## 2016-09-30 ENCOUNTER — Other Ambulatory Visit: Payer: Self-pay | Admitting: Certified Nurse Midwife

## 2016-09-30 ENCOUNTER — Encounter: Payer: Medicaid Other | Attending: Certified Nurse Midwife | Admitting: *Deleted

## 2016-09-30 ENCOUNTER — Encounter (HOSPITAL_COMMUNITY): Payer: Self-pay

## 2016-09-30 DIAGNOSIS — O24419 Gestational diabetes mellitus in pregnancy, unspecified control: Secondary | ICD-10-CM

## 2016-09-30 DIAGNOSIS — Z3A30 30 weeks gestation of pregnancy: Secondary | ICD-10-CM

## 2016-09-30 DIAGNOSIS — O10919 Unspecified pre-existing hypertension complicating pregnancy, unspecified trimester: Secondary | ICD-10-CM | POA: Insufficient documentation

## 2016-09-30 DIAGNOSIS — O4440 Low lying placenta NOS or without hemorrhage, unspecified trimester: Secondary | ICD-10-CM

## 2016-09-30 DIAGNOSIS — O0990 Supervision of high risk pregnancy, unspecified, unspecified trimester: Secondary | ICD-10-CM

## 2016-09-30 DIAGNOSIS — Z713 Dietary counseling and surveillance: Secondary | ICD-10-CM | POA: Diagnosis present

## 2016-09-30 DIAGNOSIS — R7309 Other abnormal glucose: Secondary | ICD-10-CM

## 2016-09-30 HISTORY — DX: Type 2 diabetes mellitus without complications: E11.9

## 2016-09-30 NOTE — Progress Notes (Signed)
  Patient was seen on 09/29/2016 for Gestational Diabetes self-management . She brought her Accu Chek Meter with her for instruction. She works full time as a Clinical research associate for [employer] The following learning objectives were met by the patient :   States the definition of Gestational Diabetes  States why dietary management is important in controlling blood glucose  Describes the effects of carbohydrates on blood glucose levels  Demonstrates ability to create a balanced meal plan  Demonstrates carbohydrate counting   States when to check blood glucose levels  Demonstrates proper blood glucose monitoring techniques  States the effect of stress and exercise on blood glucose levels  States the importance of limiting caffeine and abstaining from alcohol and smoking  Plan:  Aim for 3 Carb Choices per meal (45 grams) +/- 1 either way  Aim for 1-2 Carbs per snack Begin reading food labels for Total Carbohydrate of foods Consider  increasing your activity level by walking or other activity daily as tolerated Begin checking BG before breakfast and 2 hours after first bite of breakfast, lunch and dinner as directed by MD  Take medication if directed by MD  Patient already has a meter: Accu Chek Patient instructed to test pre breakfast and 2 hours each meal as directed by MD Bring Log Book to every medical appointment  BG today after her lunch was 74 mg/dl  Patient instructed to monitor glucose levels: FBS: 60 - <90 2 hour: <120  Patient received the following handouts:  Nutrition Diabetes and Pregnancy  Carbohydrate Counting List  Calorie [facility] APP  Patient will be seen for follow-up as needed.

## 2016-09-30 NOTE — Addendum Note (Signed)
Encounter addended by: Jason FilaMesha T Romero Letizia on: 09/30/2016  3:08 PM<BR>    Actions taken: Imaging Exam ended

## 2016-10-01 ENCOUNTER — Other Ambulatory Visit (HOSPITAL_COMMUNITY): Payer: Self-pay | Admitting: *Deleted

## 2016-10-01 ENCOUNTER — Other Ambulatory Visit: Payer: Self-pay | Admitting: Certified Nurse Midwife

## 2016-10-01 DIAGNOSIS — O10919 Unspecified pre-existing hypertension complicating pregnancy, unspecified trimester: Secondary | ICD-10-CM

## 2016-10-13 ENCOUNTER — Encounter: Payer: Medicaid Other | Admitting: Certified Nurse Midwife

## 2016-10-14 ENCOUNTER — Encounter (HOSPITAL_COMMUNITY): Payer: Self-pay

## 2016-10-14 ENCOUNTER — Ambulatory Visit (HOSPITAL_COMMUNITY)
Admission: RE | Admit: 2016-10-14 | Discharge: 2016-10-14 | Disposition: A | Discharge status: Home / Self Care | Payer: Medicaid Other | Source: Ambulatory Visit | Attending: Certified Nurse Midwife | Admitting: Certified Nurse Midwife

## 2016-10-14 ENCOUNTER — Ambulatory Visit (INDEPENDENT_AMBULATORY_CARE_PROVIDER_SITE_OTHER): Payer: Medicaid Other | Admitting: Certified Nurse Midwife

## 2016-10-14 VITALS — BP 122/84 | HR 78 | Wt 232.0 lb

## 2016-10-14 DIAGNOSIS — O24419 Gestational diabetes mellitus in pregnancy, unspecified control: Secondary | ICD-10-CM

## 2016-10-14 DIAGNOSIS — O1013 Pre-existing hypertensive heart disease complicating the puerperium: Secondary | ICD-10-CM | POA: Insufficient documentation

## 2016-10-14 DIAGNOSIS — O0990 Supervision of high risk pregnancy, unspecified, unspecified trimester: Secondary | ICD-10-CM

## 2016-10-14 DIAGNOSIS — Z3A32 32 weeks gestation of pregnancy: Secondary | ICD-10-CM | POA: Diagnosis not present

## 2016-10-14 DIAGNOSIS — O10919 Unspecified pre-existing hypertension complicating pregnancy, unspecified trimester: Secondary | ICD-10-CM

## 2016-10-14 DIAGNOSIS — O10913 Unspecified pre-existing hypertension complicating pregnancy, third trimester: Secondary | ICD-10-CM | POA: Diagnosis not present

## 2016-10-14 DIAGNOSIS — O10013 Pre-existing essential hypertension complicating pregnancy, third trimester: Secondary | ICD-10-CM | POA: Diagnosis present

## 2016-10-14 NOTE — Progress Notes (Signed)
   PRENATAL VISIT NOTE  Subjective:  Jill Madden is a 21 y.o. G2P0010 at 1079w5d being seen today for ongoing prenatal care.  She is currently monitored for the following issues for this high-risk pregnancy and has Supervision of high risk pregnancy, antepartum; Chronic hypertension during pregnancy; and GDM (gestational diabetes mellitus) on her problem list.  Patient reports no complaints.  Contractions: Not present. Vag. Bleeding: None.  Movement: Present. Denies leaking of fluid.   The following portions of the patient's history were reviewed and updated as appropriate: allergies, current medications, past family history, past medical history, past social history, past surgical history and problem list. Problem list updated.  Objective:   Vitals:   10/14/16 1336  BP: 122/84  Pulse: 78  Weight: 232 lb (105.2 kg)    Fetal Status:     Movement: Present     General:  Alert, oriented and cooperative. Patient is in no acute distress.  Skin: Skin is warm and dry. No rash noted.   Cardiovascular: Normal heart rate noted  Respiratory: Normal respiratory effort, no problems with respiration noted  Abdomen: Soft, gravid, appropriate for gestational age. Pain/Pressure: Present     Pelvic:  Cervical exam deferred        Extremities: Normal range of motion.     Mental Status: Normal mood and affect. Normal behavior. Normal judgment and thought content.   CBG's: FBS: 70-86, 1 reading of 91 2 hour PP: 88-120, X1 reading of 142.    Assessment and Plan:  Pregnancy: G2P0010 at 3579w5d  1. Chronic hypertension during pregnancy     Normotensive today.       Taking Baby ASA  2. Gestational diabetes mellitus (GDM) in third trimester, gestational diabetes method of control unspecified     Diet control going well.  CBGs reviewed with Dr. Clearance CootsHarper. No medications indicated at this time.   3. Supervision of high risk pregnancy, antepartum      Reactive NST.  NST: + accels: range: 120-160, no  decels, moderate variability, Cat. 1 tracing. No contractions on toco.   - Fetal non-stress test  Preterm labor symptoms and general obstetric precautions including but not limited to vaginal bleeding, contractions, leaking of fluid and fetal movement were reviewed in detail with the patient. Please refer to After Visit Summary for other counseling recommendations.  [facility] Twice weekly NSTs and weekly HOB visits until delivery.   Roe Coombsachelle A Jae Bruck, CNM

## 2016-10-19 ENCOUNTER — Other Ambulatory Visit: Payer: Self-pay

## 2016-10-19 DIAGNOSIS — O10913 Unspecified pre-existing hypertension complicating pregnancy, third trimester: Secondary | ICD-10-CM

## 2016-10-20 ENCOUNTER — Ambulatory Visit (INDEPENDENT_AMBULATORY_CARE_PROVIDER_SITE_OTHER): Payer: Medicaid Other | Admitting: Obstetrics and Gynecology

## 2016-10-20 VITALS — BP 137/88 | HR 89 | Wt 233.0 lb

## 2016-10-20 DIAGNOSIS — O10919 Unspecified pre-existing hypertension complicating pregnancy, unspecified trimester: Secondary | ICD-10-CM

## 2016-10-20 DIAGNOSIS — O24419 Gestational diabetes mellitus in pregnancy, unspecified control: Secondary | ICD-10-CM

## 2016-10-20 DIAGNOSIS — O10913 Unspecified pre-existing hypertension complicating pregnancy, third trimester: Secondary | ICD-10-CM

## 2016-10-20 DIAGNOSIS — O0990 Supervision of high risk pregnancy, unspecified, unspecified trimester: Secondary | ICD-10-CM

## 2016-10-20 NOTE — Progress Notes (Signed)
   PRENATAL VISIT NOTE  Subjective:  Jill Madden is a 21 y.o. G2P0010 at 442w4d being seen today for ongoing prenatal care.  She is currently monitored for the following issues for this high-risk pregnancy and has Supervision of high risk pregnancy, antepartum; Chronic hypertension during pregnancy; and GDM (gestational diabetes mellitus) on her problem list.  Patient reports no complaints.  Contractions: Not present. Vag. Bleeding: None.  Movement: Present. Denies leaking of fluid.   The following portions of the patient's history were reviewed and updated as appropriate: allergies, current medications, past family history, past medical history, past social history, past surgical history and problem list. Problem list updated.  Objective:   Vitals:   10/20/16 0832  BP: 137/88  Pulse: 89  Weight: 233 lb (105.7 kg)    Fetal Status: Fetal Heart Rate (bpm): NST Fundal Height: 34 cm Movement: Present     General:  Alert, oriented and cooperative. Patient is in no acute distress.  Skin: Skin is warm and dry. No rash noted.   Cardiovascular: Normal heart rate noted  Respiratory: Normal respiratory effort, no problems with respiration noted  Abdomen: Soft, gravid, appropriate for gestational age. Pain/Pressure: Present     Pelvic:  Cervical exam deferred        Extremities: Normal range of motion.  Edema: Trace  Mental Status: Normal mood and affect. Normal behavior. Normal judgment and thought content.   Assessment and Plan:  Pregnancy: G2P0010 at 3842w4d  1. Chronic hypertension during pregnancy Continue daily ASA. Not on any antihypertensive 2/8 EFW 57%tile- Next growth ultrasound on 3/8 NST reviewed and reactive with baseline 140, mod variability, + accels, no decels  2. Supervision of high risk pregnancy, antepartum Patient is doing well without complaints  3. Gestational diabetes mellitus (GDM) in third trimester, gestational diabetes method of control unspecified CBGs and  great majority within range with one fasting value of 100 Continue diet control  Preterm labor symptoms and general obstetric precautions including but not limited to vaginal bleeding, contractions, leaking of fluid and fetal movement were reviewed in detail with the patient. Please refer to After Visit Summary for other counseling recommendations.  Return in about 2 days (around 10/22/2016) for NST.   Catalina AntiguaPeggy Thai Hemrick, MD

## 2016-10-25 ENCOUNTER — Encounter: Payer: Medicaid Other | Admitting: Obstetrics and Gynecology

## 2016-10-25 ENCOUNTER — Ambulatory Visit (INDEPENDENT_AMBULATORY_CARE_PROVIDER_SITE_OTHER): Payer: Medicaid Other | Admitting: Obstetrics & Gynecology

## 2016-10-25 VITALS — BP 140/92 | HR 85 | Wt 233.0 lb

## 2016-10-25 DIAGNOSIS — O10919 Unspecified pre-existing hypertension complicating pregnancy, unspecified trimester: Secondary | ICD-10-CM

## 2016-10-25 DIAGNOSIS — O24419 Gestational diabetes mellitus in pregnancy, unspecified control: Secondary | ICD-10-CM

## 2016-10-25 DIAGNOSIS — O0990 Supervision of high risk pregnancy, unspecified, unspecified trimester: Secondary | ICD-10-CM

## 2016-10-25 DIAGNOSIS — O10913 Unspecified pre-existing hypertension complicating pregnancy, third trimester: Secondary | ICD-10-CM

## 2016-10-25 MED ORDER — LABETALOL HCL 100 MG PO TABS: 200.0000 mg | ORAL_TABLET | Freq: Two times a day (BID) | ORAL | 3 refills | Status: DC

## 2016-10-25 NOTE — Progress Notes (Signed)
   PRENATAL VISIT NOTE  Subjective:  Jill Madden is a 21 y.o. G2P0010 at 3280w2d being seen today for ongoing prenatal care.  She is currently monitored for the following issues for this high-risk pregnancy and has Supervision of high risk pregnancy, antepartum; Chronic hypertension during pregnancy; and GDM (gestational diabetes mellitus) on her problem list.  Patient reports no complaints.  Contractions: Not present. Vag. Bleeding: None.  Movement: Present. Denies leaking of fluid.   The following portions of the patient's history were reviewed and updated as appropriate: allergies, current medications, past family history, past medical history, past social history, past surgical history and problem list. Problem list updated.  Objective:   Vitals:   10/25/16 0815  BP: (!) 140/92  Pulse: 85  Weight: 105.7 kg (233 lb)    Fetal Status: Fetal Heart Rate (bpm): NST   Movement: Present     General:  Alert, oriented and cooperative. Patient is in no acute distress.  Skin: Skin is warm and dry. No rash noted.   Cardiovascular: Normal heart rate noted  Respiratory: Normal respiratory effort, no problems with respiration noted  Abdomen: Soft, gravid, appropriate for gestational age. Pain/Pressure: Absent     Pelvic:  Cervical exam deferred        Extremities: Normal range of motion.  Edema: Trace  Mental Status: Normal mood and affect. Normal behavior. Normal judgment and thought content.   Assessment and Plan:  Pregnancy: G2P0010 at 3480w2d  1. Chronic hypertension during pregnancy - I will add labetalol 200 mg BID -continue twice weekly testing, next MFM appt is 10-28-16  2. Gestational diabetes mellitus (GDM) in third trimester, gestational diabetes method of control unspecified - perfect sugars, rec only 4 checks per day  3. Supervision of high risk pregnancy, antepartum   Preterm labor symptoms and general obstetric precautions including but not limited to vaginal bleeding,  contractions, leaking of fluid and fetal movement were reviewed in detail with the patient. Please refer to After Visit Summary for other counseling recommendations.  Return for continue twice weekly testing.   Allie BossierMyra C Pranshu Lyster, MD

## 2016-10-25 NOTE — Progress Notes (Signed)
Patient presents for ROB and NST. Patient placed on Machine.

## 2016-10-27 ENCOUNTER — Encounter: Payer: Medicaid Other | Admitting: Obstetrics & Gynecology

## 2016-10-28 ENCOUNTER — Encounter (HOSPITAL_COMMUNITY): Payer: Self-pay

## 2016-10-28 ENCOUNTER — Ambulatory Visit (HOSPITAL_COMMUNITY): Payer: No Typology Code available for payment source

## 2016-10-28 ENCOUNTER — Ambulatory Visit (HOSPITAL_COMMUNITY)
Admission: RE | Admit: 2016-10-28 | Discharge: 2016-10-28 | Disposition: A | Discharge status: Home / Self Care | Payer: Medicaid Other | Source: Ambulatory Visit | Attending: Certified Nurse Midwife | Admitting: Certified Nurse Midwife

## 2016-10-28 DIAGNOSIS — Z3A34 34 weeks gestation of pregnancy: Secondary | ICD-10-CM | POA: Insufficient documentation

## 2016-10-28 DIAGNOSIS — O10013 Pre-existing essential hypertension complicating pregnancy, third trimester: Secondary | ICD-10-CM | POA: Diagnosis present

## 2016-10-28 DIAGNOSIS — O10919 Unspecified pre-existing hypertension complicating pregnancy, unspecified trimester: Secondary | ICD-10-CM

## 2016-10-28 DIAGNOSIS — O24410 Gestational diabetes mellitus in pregnancy, diet controlled: Secondary | ICD-10-CM | POA: Diagnosis not present

## 2016-11-01 ENCOUNTER — Encounter: Payer: Medicaid Other | Admitting: Obstetrics and Gynecology

## 2016-11-04 ENCOUNTER — Encounter: Payer: Medicaid Other | Admitting: Obstetrics and Gynecology

## 2016-11-04 ENCOUNTER — Ambulatory Visit (HOSPITAL_COMMUNITY): Payer: No Typology Code available for payment source

## 2016-11-04 ENCOUNTER — Encounter (HOSPITAL_COMMUNITY): Payer: Self-pay

## 2016-11-04 ENCOUNTER — Inpatient Hospital Stay (HOSPITAL_COMMUNITY)
Admission: AD | Admit: 2016-11-04 | Discharge: 2016-11-04 | Disposition: A | Discharge status: Home / Self Care | Payer: Medicaid Other | Source: Ambulatory Visit | Attending: Obstetrics and Gynecology | Admitting: Obstetrics and Gynecology

## 2016-11-04 ENCOUNTER — Other Ambulatory Visit: Payer: Self-pay | Admitting: Certified Nurse Midwife

## 2016-11-04 ENCOUNTER — Ambulatory Visit (HOSPITAL_COMMUNITY)
Admission: RE | Admit: 2016-11-04 | Discharge: 2016-11-04 | Disposition: A | Discharge status: Home / Self Care | Payer: Medicaid Other | Source: Ambulatory Visit | Attending: Certified Nurse Midwife | Admitting: Certified Nurse Midwife

## 2016-11-04 DIAGNOSIS — O0990 Supervision of high risk pregnancy, unspecified, unspecified trimester: Secondary | ICD-10-CM

## 2016-11-04 DIAGNOSIS — O26893 Other specified pregnancy related conditions, third trimester: Secondary | ICD-10-CM | POA: Diagnosis not present

## 2016-11-04 DIAGNOSIS — Z3A35 35 weeks gestation of pregnancy: Secondary | ICD-10-CM

## 2016-11-04 DIAGNOSIS — Z87891 Personal history of nicotine dependence: Secondary | ICD-10-CM | POA: Insufficient documentation

## 2016-11-04 DIAGNOSIS — O24419 Gestational diabetes mellitus in pregnancy, unspecified control: Secondary | ICD-10-CM | POA: Diagnosis not present

## 2016-11-04 DIAGNOSIS — Z7982 Long term (current) use of aspirin: Secondary | ICD-10-CM | POA: Diagnosis not present

## 2016-11-04 DIAGNOSIS — R030 Elevated blood-pressure reading, without diagnosis of hypertension: Secondary | ICD-10-CM | POA: Insufficient documentation

## 2016-11-04 DIAGNOSIS — O10913 Unspecified pre-existing hypertension complicating pregnancy, third trimester: Secondary | ICD-10-CM

## 2016-11-04 DIAGNOSIS — O24410 Gestational diabetes mellitus in pregnancy, diet controlled: Secondary | ICD-10-CM

## 2016-11-04 DIAGNOSIS — O10919 Unspecified pre-existing hypertension complicating pregnancy, unspecified trimester: Secondary | ICD-10-CM

## 2016-11-04 DIAGNOSIS — O10013 Pre-existing essential hypertension complicating pregnancy, third trimester: Secondary | ICD-10-CM | POA: Insufficient documentation

## 2016-11-04 HISTORY — DX: Gestational diabetes mellitus in pregnancy, unspecified control: O24.419

## 2016-11-04 LAB — URINALYSIS, ROUTINE W REFLEX MICROSCOPIC
Bilirubin Urine: NEGATIVE
GLUCOSE, UA: NEGATIVE mg/dL
Hgb urine dipstick: NEGATIVE
Ketones, ur: 5 mg/dL — AB
NITRITE: NEGATIVE
PH: 7 (ref 5.0–8.0)
Protein, ur: NEGATIVE mg/dL
SPECIFIC GRAVITY, URINE: 1.009 (ref 1.005–1.030)

## 2016-11-04 LAB — CBC
HEMATOCRIT: 31 % — AB (ref 36.0–46.0)
HEMOGLOBIN: 10.3 g/dL — AB (ref 12.0–15.0)
MCH: 29.3 pg (ref 26.0–34.0)
MCHC: 33.2 g/dL (ref 30.0–36.0)
MCV: 88.1 fL (ref 78.0–100.0)
Platelets: 283 10*3/uL (ref 150–400)
RBC: 3.52 MIL/uL — ABNORMAL LOW (ref 3.87–5.11)
RDW: 15.2 % (ref 11.5–15.5)
WBC: 10.3 10*3/uL (ref 4.0–10.5)

## 2016-11-04 LAB — COMPREHENSIVE METABOLIC PANEL
ALBUMIN: 3.1 g/dL — AB (ref 3.5–5.0)
ALK PHOS: 138 U/L — AB (ref 38–126)
ALT: 13 U/L — ABNORMAL LOW (ref 14–54)
ANION GAP: 10 (ref 5–15)
AST: 11 U/L — ABNORMAL LOW (ref 15–41)
BILIRUBIN TOTAL: 0.6 mg/dL (ref 0.3–1.2)
CALCIUM: 8.8 mg/dL — AB (ref 8.9–10.3)
CO2: 22 mmol/L (ref 22–32)
CREATININE: 0.62 mg/dL (ref 0.44–1.00)
Chloride: 101 mmol/L (ref 101–111)
GFR calc Af Amer: >60 mL/min (ref >60)
GLUCOSE: 123 mg/dL — AB (ref 65–99)
POTASSIUM: 3.4 mmol/L — AB (ref 3.5–5.1)
Sodium: 133 mmol/L — ABNORMAL LOW (ref 135–145)
TOTAL PROTEIN: 6.3 g/dL — AB (ref 6.5–8.1)

## 2016-11-04 LAB — PROTEIN / CREATININE RATIO, URINE
CREATININE, URINE: 91 mg/dL
PROTEIN CREATININE RATIO: 0.2 mg/mg{creat} — AB (ref 0.00–0.15)
TOTAL PROTEIN, URINE: 18 mg/dL

## 2016-11-04 NOTE — MAU Provider Note (Signed)
History    [name] is a 21yo female G2P0010 presenting [facility]. She states she has been having borderline to mildly elevated blood pressures since her first trimester and was given Aspirin, but last week she was started on labetalol 200 BID for increasing measures (cannot precise values). This morning she had an app at [facility] and skipped her morning dose of labetalol. During her app ehr BP was noted to be 143/94 and she was referred to MAU. At MAU her BP has been varying between SBP 125-137 and DBP 68-92.  Denies headaches, blurred vision, vision changes, leg swelling, RUQ pain, dizziness, N/V. Dnies SOB, dysuria, chest pain. Denies fevers.   Feels fetus moving. Denies fluid leakage or vaginal bleeding.    CSN: [id number] Arrival date and time: 11/04/16 0840   First Provider Initiated Contact with Patient 11/04/16 [phone]     Chief Complaint  Patient presents with  . Hypertension   HPI  OB History    Gravida Para Term Preterm AB Living   2       1 0   SAB TAB Ectopic Multiple Live Births     1            Past Medical History:  Diagnosis Date  . Diabetes mellitus without complication ([place]   . Gestational diabetes   . Hypertension    adolescent  . Pyelonephritis     Past Surgical History:  Procedure Laterality Date  . NO PAST SURGERIES      Family History  Problem Relation Age of Onset  . Diabetes Sister   . Diabetes Maternal Grandmother     Social History  Substance Use Topics  . Smoking status: Former Smoker    Packs/day: 0.50    Types: Cigars  . Smokeless tobacco: Never Used  . Alcohol use No    Allergies: No Known Allergies  Prescriptions Prior to Admission  Medication Sig Dispense Refill Last Dose  . aspirin EC 81 MG tablet Take 1 tablet (81 mg total) by mouth daily. 30 tablet 2 11/04/2016 at Unknown time  . labetalol (NORMODYNE) 100 MG tablet Take 2 tablets (200 mg total) by mouth 2 (two) times daily. 60 tablet  3 11/04/2016 at 0750  . Prenatal Vit-Fe Fumarate-FA (PRENATAL MULTIVITAMIN) TABS tablet Take 1 tablet by mouth daily at 12 noon.   11/04/2016 at Unknown time  . ACCU-CHEK FASTCLIX LANCETS MISC 1 Units by Percutaneous route 4 (four) times daily. 100 each 12 Taking  . Blood Glucose Monitoring Suppl (ACCU-CHEK NANO SMARTVIEW) w/Device KIT 1 kit by Subdermal route as directed. Check blood sugars for fasting, and two hours after breakfast, lunch and dinner (4 checks daily) 1 kit 0 Taking  . glucose blood (ACCU-CHEK SMARTVIEW) test strip Use as instructed to check blood sugars 100 each 12 Taking    Review of Systems  Constitutional: Negative for fatigue and fever.  HENT: Negative for rhinorrhea.   Eyes: Negative for visual disturbance.  Respiratory: Negative for apnea, cough and shortness of breath.   Cardiovascular: Positive for leg swelling. Negative for chest pain and palpitations.       Mild leg swelling, the same present during pregnancy  Gastrointestinal: Negative for abdominal pain, diarrhea, nausea and vomiting.  Genitourinary: Negative for dysuria, hematuria, pelvic pain and vaginal bleeding.  Musculoskeletal: Negative for myalgias.  Skin: Negative for pallor and rash.  Neurological: Negative for dizziness and headaches.   Physical Exam  Blood pressure 137/68, pulse 81, temperature 99 F (37.2 C), resp. rate 18, height 5' 7.75" (1.721 m), weight 238 lb 0.6 oz (108 kg), last menstrual period 02/28/2016.  Vitals:   11/04/16 1016 11/04/16 1032 11/04/16 1047 11/04/16 1102  BP: 131/70 131/66 129/64 125/81  Pulse: 86 79 83 85  Resp:      Temp:      Weight:      Height:         Physical Exam  Constitutional: She is oriented to person, place, and time. She appears well-developed and well-nourished.  HENT:  Head: Normocephalic and atraumatic.  Eyes: Conjunctivae and EOM are normal. Pupils are equal, round, and reactive to light.  Cardiovascular: Intact distal pulses.   Respiratory:  Effort normal. No respiratory distress.  GI: Soft. She exhibits no distension. There is no tenderness. There is no rebound and no guarding.  Musculoskeletal: She exhibits edema.  1/4  Neurological: She is alert and oriented to person, place, and time.  Skin: Skin is warm and dry. No rash noted. No pallor.  Psychiatric: She has a normal mood and affect. Her behavior is normal.    MAU Course  Procedures  MDM CBC, CMP, P/C  (baseline P/C 0.27) Serial BP Labetalol 200 (morning dose)  Assessment and Plan  [name] is a 21yo female G2P0010 [facility] after skipping her morning dose of labetalol. During her stay at MAU her BPs have been wnl, PIH labs were drawn and found to be wnl. She is now suitable to be d/c home, instructed to continue prenatal care and return earlier if having any alarming signs.  AAllene Pyodo AMadrid3/15/2018, 9:27 AM    OB FELLOW MAU DISCHARGE ATTESTATION  I have seen and examined this patient; I agree with above documentation in the resident's note. Patient's blood pressures were normal after taking her labetalol (which she did not take prior to her MFM appt). She has no symptoms of preE. Her preE labs were normal. NST was reactive. OK to go home, reminded patient to not miss doses if she can, and to take them even if she cannot eat. Warning signs/symptoms given for return precautions.  I personally reviewed the patient's NST today, found to be REACTIVE. 135 bpm, mod var, +accels, no decels. CTX: None.   EKatherine Basset DO OB Fellow

## 2016-11-04 NOTE — Discharge Instructions (Signed)
Hypertension During Pregnancy °Hypertension, commonly called high blood pressure, is when the force of blood pumping through your arteries is too strong. Arteries are blood vessels that carry blood from the heart throughout the body. Hypertension during pregnancy can cause problems for you and your baby. Your baby may be born early (prematurely) or may not weigh as much as he or she should at birth. Very bad cases of hypertension during pregnancy can be life-threatening. °Different types of hypertension can occur during pregnancy. These include: °· Chronic hypertension. This happens when: °¨ You have hypertension before pregnancy and it continues during pregnancy. °¨ You develop hypertension before you are [redacted] weeks pregnant, and it continues during pregnancy. °· Gestational hypertension. This is hypertension that develops after the 20th week of pregnancy. °· Preeclampsia, also called toxemia of pregnancy. This is a very serious type of hypertension that develops only during pregnancy. It affects the whole body, and it can be very dangerous for you and your baby. °Gestational hypertension and preeclampsia usually go away within 6 weeks after your baby is born. Women who have hypertension during pregnancy have a greater chance of developing hypertension later in life or during future pregnancies. °What are the causes? °The exact cause of hypertension is not known. °What increases the risk? °There are certain factors that make it more likely for you to develop hypertension during pregnancy. These include: °· Having hypertension during a previous pregnancy or prior to pregnancy. °· Being overweight. °· Being older than age 40. °· Being pregnant for the first time or being pregnant with more than one baby. °· Becoming pregnant using fertilization methods such as IVF (in vitro fertilization). °· Having diabetes, kidney problems, or systemic lupus erythematosus. °· Having a family history of hypertension. °What are the  signs or symptoms? °Chronic hypertension and gestational hypertension rarely cause symptoms. Preeclampsia causes symptoms, which may include: °· Increased protein in your urine. Your health care provider will check for this at every visit before you give birth (prenatal visit). °· Severe headaches. °· Sudden weight gain. °· Swelling of the hands, face, legs, and feet. °· Nausea and vomiting. °· Vision problems, such as blurred or double vision. °· Numbness in the face, arms, legs, and feet. °· Dizziness. °· Slurred speech. °· Sensitivity to bright lights. °· Abdominal pain. °· Convulsions. °How is this diagnosed? °You may be diagnosed with hypertension during a routine prenatal exam. At each prenatal visit, you may: °· Have a urine test to check for high amounts of protein in your urine. °· Have your blood pressure checked. A blood pressure reading is recorded as two numbers, such as "120 over 80" (or 120/80). The first ("top") number is called the systolic pressure. It is a measure of the pressure in your arteries when your heart beats. The second ("bottom") number is called the diastolic pressure. It is a measure of the pressure in your arteries as your heart relaxes between beats. Blood pressure is measured in a unit called mm Hg. A normal blood pressure reading is: °¨ Systolic: below 120. °¨ Diastolic: below 80. °The type of hypertension that you are diagnosed with depends on your test results and when your symptoms developed. °· Chronic hypertension is usually diagnosed before 20 weeks of pregnancy. °· Gestational hypertension is usually diagnosed after 20 weeks of pregnancy. °· Hypertension with high amounts of protein in the urine is diagnosed as preeclampsia. °· Blood pressure measurements that stay above 160 systolic, or above 110 diastolic, are signs of severe preeclampsia. °  How is this treated? °Treatment for hypertension during pregnancy varies depending on the type of hypertension you have and how  serious it is. °· If you take medicines called ACE inhibitors to treat chronic hypertension, you may need to switch medicines. ACE inhibitors should not be taken during pregnancy. °· If you have gestational hypertension, you may need to take blood pressure medicine. °· If you are at risk for preeclampsia, your health care provider may recommend that you take a low-dose aspirin every day to prevent high blood pressure during your pregnancy. °· If you have severe preeclampsia, you may need to be hospitalized so you and your baby can be monitored closely. You may also need to take medicine (magnesium sulfate) to prevent seizures and to lower blood pressure. This medicine may be given as an injection or through an IV tube. °· In some cases, if your condition gets worse, you may need to deliver your baby early. °Follow these instructions at home: °Eating and drinking °· Drink enough fluid to keep your urine clear or pale yellow. °· Eat a healthy diet that is low in salt (sodium). Do not add salt to your food. Check food labels to see how much sodium a food or beverage contains. °Lifestyle °· Do not use any products that contain nicotine or tobacco, such as cigarettes and e-cigarettes. If you need help quitting, ask your health care provider. °· Do not use alcohol. °· Avoid caffeine. °· Avoid stress as much as possible. Rest and get plenty of sleep. °General instructions °· Take over-the-counter and prescription medicines only as told by your health care provider. °· While lying down, lie on your left side. This keeps pressure off your baby. °· While sitting or lying down, raise (elevate) your feet. Try putting some pillows under your lower legs. °· Exercise regularly. Ask your health care provider what kinds of exercise are best for you. °· Keep all prenatal and follow-up visits as told by your health care provider. This is important. °Contact a health care provider if: °· You have symptoms that your health care provider  told you may require more treatment or monitoring, such as: °¨ Fever. °¨ Vomiting. °¨ Headache. °Get help right away if: °· You have severe abdominal pain or vomiting that does not get better with treatment. °· You suddenly develop swelling in your hands, ankles, or face. °· You gain 4 lbs (1.8 kg) or more in 1 week. °· You develop vaginal bleeding, or you have blood in your urine. °· You do not feel your baby moving as much as usual. °· You have blurred or double vision. °· You have muscle twitching or sudden tightening (spasms). °· You have shortness of breath. °· Your lips or fingernails turn blue. °This information is not intended to replace advice given to you by your health care provider. Make sure you discuss any questions you have with your health care provider. °Document Released: 04/27/2011 Document Revised: 02/27/2016 Document Reviewed: 01/23/2016 °Elsevier Interactive Patient Education © 2017 [employer] ° °

## 2016-11-04 NOTE — MAU Note (Signed)
Pt sent over from [facility] for elevated b/p . Pt has gestational DM and chronic HTN. Had not taken labetalol this morning yet. Took it while she was waiting. Denies Headache or visual changes.

## 2016-11-05 ENCOUNTER — Other Ambulatory Visit: Payer: Self-pay | Admitting: Certified Nurse Midwife

## 2016-11-08 ENCOUNTER — Other Ambulatory Visit: Payer: Self-pay | Admitting: Obstetrics and Gynecology

## 2016-11-08 ENCOUNTER — Ambulatory Visit (INDEPENDENT_AMBULATORY_CARE_PROVIDER_SITE_OTHER): Payer: Medicaid Other | Admitting: Obstetrics and Gynecology

## 2016-11-08 ENCOUNTER — Other Ambulatory Visit (HOSPITAL_COMMUNITY)
Admission: RE | Admit: 2016-11-08 | Discharge: 2016-11-08 | Disposition: A | Discharge status: Home / Self Care | Payer: Medicaid Other | Source: Ambulatory Visit | Attending: Obstetrics and Gynecology | Admitting: Obstetrics and Gynecology

## 2016-11-08 ENCOUNTER — Encounter: Payer: Medicaid Other | Admitting: Obstetrics and Gynecology

## 2016-11-08 ENCOUNTER — Ambulatory Visit (HOSPITAL_COMMUNITY)
Admission: RE | Admit: 2016-11-08 | Discharge: 2016-11-08 | Disposition: A | Discharge status: Home / Self Care | Payer: Medicaid Other | Source: Ambulatory Visit | Attending: Obstetrics and Gynecology | Admitting: Obstetrics and Gynecology

## 2016-11-08 VITALS — BP 129/81 | HR 87 | Wt 237.0 lb

## 2016-11-08 DIAGNOSIS — O10919 Unspecified pre-existing hypertension complicating pregnancy, unspecified trimester: Secondary | ICD-10-CM

## 2016-11-08 DIAGNOSIS — O10913 Unspecified pre-existing hypertension complicating pregnancy, third trimester: Secondary | ICD-10-CM | POA: Diagnosis not present

## 2016-11-08 DIAGNOSIS — Z113 Encounter for screening for infections with a predominantly sexual mode of transmission: Secondary | ICD-10-CM | POA: Insufficient documentation

## 2016-11-08 DIAGNOSIS — Z3A36 36 weeks gestation of pregnancy: Secondary | ICD-10-CM

## 2016-11-08 DIAGNOSIS — O283 Abnormal ultrasonic finding on antenatal screening of mother: Secondary | ICD-10-CM | POA: Diagnosis not present

## 2016-11-08 DIAGNOSIS — O24419 Gestational diabetes mellitus in pregnancy, unspecified control: Secondary | ICD-10-CM

## 2016-11-08 DIAGNOSIS — O0990 Supervision of high risk pregnancy, unspecified, unspecified trimester: Secondary | ICD-10-CM

## 2016-11-08 DIAGNOSIS — O10013 Pre-existing essential hypertension complicating pregnancy, third trimester: Secondary | ICD-10-CM | POA: Diagnosis not present

## 2016-11-08 DIAGNOSIS — O288 Other abnormal findings on antenatal screening of mother: Secondary | ICD-10-CM

## 2016-11-08 DIAGNOSIS — O24410 Gestational diabetes mellitus in pregnancy, diet controlled: Secondary | ICD-10-CM | POA: Insufficient documentation

## 2016-11-08 NOTE — Progress Notes (Signed)
   PRENATAL VISIT NOTE  Subjective:  Jill Madden is a 21 y.o. G2P0010 at 4972w2d being seen today for ongoing prenatal care.  She is currently monitored for the following issues for this high-risk pregnancy and has Supervision of high risk pregnancy, antepartum; Chronic hypertension during pregnancy; and GDM (gestational diabetes mellitus) on her problem list.  Patient reports no complaints.  Contractions: Not present. Vag. Bleeding: None.  Movement: Present. Denies leaking of fluid.   The following portions of the patient's history were reviewed and updated as appropriate: allergies, current medications, past family history, past medical history, past social history, past surgical history and problem list. Problem list updated.  Objective:   Vitals:   11/08/16 0844  BP: 129/81  Pulse: 87  Weight: 237 lb (107.5 kg)    Fetal Status: Fetal Heart Rate (bpm): NST Fundal Height: 36 cm Movement: Present  Presentation: Vertex  General:  Alert, oriented and cooperative. Patient is in no acute distress.  Skin: Skin is warm and dry. No rash noted.   Cardiovascular: Normal heart rate noted  Respiratory: Normal respiratory effort, no problems with respiration noted  Abdomen: Soft, gravid, appropriate for gestational age. Pain/Pressure: Absent     Pelvic:  Cervical exam performed Dilation: Closed Effacement (%): 0 Station: -3  Extremities: Normal range of motion.  Edema: Mild pitting, slight indentation  Mental Status: Normal mood and affect. Normal behavior. Normal judgment and thought content.   Assessment and Plan:  Pregnancy: G2P0010 at 7072w2d  1. Chronic hypertension during pregnancy BP well controlled on labetalol 200 BID Continue labetalol and ASA Follow up BPP on 3/22 NST reviewed- baseline 135, mod variability, + 10x 10 accels, no decels Patient sent for BPP   2. Gestational diabetes mellitus (GDM) in third trimester, gestational diabetes method of control unspecified CBGs all  within range- diet controlled Congratulated patient on her efforts  3. Supervision of high risk pregnancy, antepartum Patient is doing well Cultures today Will plan for IOL at 39 weeks  Preterm labor symptoms and general obstetric precautions including but not limited to vaginal bleeding, contractions, leaking of fluid and fetal movement were reviewed in detail with the patient. Please refer to After Visit Summary for other counseling recommendations.  Return in 1 week (on 11/15/2016) for ROB, NST.   Catalina AntiguaPeggy Beverlyn Mcginness, MD

## 2016-11-08 NOTE — Patient Instructions (Signed)
Third Trimester of Pregnancy The third trimester is from week 28 through week 40 (months 7 through 9). The third trimester is a time when the unborn baby (fetus) is growing rapidly. At the end of the ninth month, the fetus is about 20 inches in length and weighs 6-10 pounds. Body changes during your third trimester Your body will continue to go through many changes during pregnancy. The changes vary from woman to woman. During the third trimester:  Your weight will continue to increase. You can expect to gain 25-35 pounds (11-16 kg) by the end of the pregnancy.  You may begin to get stretch marks on your hips, abdomen, and breasts.  You may urinate more often because the fetus is moving lower into your pelvis and pressing on your bladder.  You may develop or continue to have heartburn. This is caused by increased hormones that slow down muscles in the digestive tract.  You may develop or continue to have constipation because increased hormones slow digestion and cause the muscles that push waste through your intestines to relax.  You may develop hemorrhoids. These are swollen veins (varicose veins) in the rectum that can itch or be painful.  You may develop swollen, bulging veins (varicose veins) in your legs.  You may have increased body aches in the pelvis, back, or thighs. This is due to weight gain and increased hormones that are relaxing your joints.  You may have changes in your hair. These can include thickening of your hair, rapid growth, and changes in texture. Some women also have hair loss during or after pregnancy, or hair that feels dry or thin. Your hair will most likely return to normal after your baby is born.  Your breasts will continue to grow and they will continue to become tender. A yellow fluid (colostrum) may leak from your breasts. This is the first milk you are producing for your baby.  Your belly button may stick out.  You may notice more swelling in your hands,  face, or ankles.  You may have increased tingling or numbness in your hands, arms, and legs. The skin on your belly may also feel numb.  You may feel short of breath because of your expanding uterus.  You may have more problems sleeping. This can be caused by the size of your belly, increased need to urinate, and an increase in your body's metabolism.  You may notice the fetus "dropping," or moving lower in your abdomen (lightening).  You may have increased vaginal discharge.  You may notice your joints feel loose and you may have pain around your pelvic bone.  What to expect at prenatal visits You will have prenatal exams every 2 weeks until week 36. Then you will have weekly prenatal exams. During a routine prenatal visit:  You will be weighed to make sure you and the baby are growing normally.  Your blood pressure will be taken.  Your abdomen will be measured to track your baby's growth.  The fetal heartbeat will be listened to.  Any test results from the previous visit will be discussed.  You may have a cervical check near your due date to see if your cervix has softened or thinned (effaced).  You will be tested for Group B streptococcus. This happens between 35 and 37 weeks.  Your health care provider may ask you:  What your birth plan is.  How you are feeling.  If you are feeling the baby move.  If you have had   any abnormal symptoms, such as leaking fluid, bleeding, severe headaches, or abdominal cramping.  If you are using any tobacco products, including cigarettes, chewing tobacco, and electronic cigarettes.  If you have any questions.  Other tests or screenings that may be performed during your third trimester include:  Blood tests that check for low iron levels (anemia).  Fetal testing to check the health, activity level, and growth of the fetus. Testing is done if you have certain medical conditions or if there are problems during the  pregnancy.  Nonstress test (NST). This test checks the health of your baby to make sure there are no signs of problems, such as the baby not getting enough oxygen. During this test, a belt is placed around your belly. The baby is made to move, and its heart rate is monitored during movement.  What is false labor? False labor is a condition in which you feel small, irregular tightenings of the muscles in the womb (contractions) that usually go away with rest, changing position, or drinking water. These are called Braxton Hicks contractions. Contractions may last for hours, days, or even weeks before true labor sets in. If contractions come at regular intervals, become more frequent, increase in intensity, or become painful, you should see your health care provider. What are the signs of labor?  Abdominal cramps.  Regular contractions that start at 10 minutes apart and become stronger and more frequent with time.  Contractions that start on the top of the uterus and spread down to the lower abdomen and back.  Increased pelvic pressure and dull back pain.  A watery or bloody mucus discharge that comes from the vagina.  Leaking of amniotic fluid. This is also known as your "water breaking." It could be a slow trickle or a gush. Let your health care provider know if it has a color or strange odor. If you have any of these signs, call your health care provider right away, even if it is before your due date. Follow these instructions at home: Medicines  Follow your health care provider's instructions regarding medicine use. Specific medicines may be either safe or unsafe to take during pregnancy.  Take a prenatal vitamin that contains at least 600 micrograms (mcg) of folic acid.  If you develop constipation, try taking a stool softener if your health care provider approves. Eating and drinking  Eat a balanced diet that includes fresh fruits and vegetables, whole grains, good sources of protein  such as meat, eggs, or tofu, and low-fat dairy. Your health care provider will help you determine the amount of weight gain that is right for you.  Avoid raw meat and uncooked cheese. These carry germs that can cause birth defects in the baby.  If you have low calcium intake from food, talk to your health care provider about whether you should take a daily calcium supplement.  Eat four or five small meals rather than three large meals a day.  Limit foods that are high in fat and processed sugars, such as fried and sweet foods.  To prevent constipation: ? Drink enough fluid to keep your urine clear or pale yellow. ? Eat foods that are high in fiber, such as fresh fruits and vegetables, whole grains, and beans. Activity  Exercise only as directed by your health care provider. Most women can continue their usual exercise routine during pregnancy. Try to exercise for 30 minutes at least 5 days a week. Stop exercising if you experience uterine contractions.  Avoid heavy   lifting.  Do not exercise in extreme heat or humidity, or at high altitudes.  Wear low-heel, comfortable shoes.  Practice good posture.  You may continue to have sex unless your health care provider tells you otherwise. Relieving pain and discomfort  Take frequent breaks and rest with your legs elevated if you have leg cramps or low back pain.  Take warm sitz baths to soothe any pain or discomfort caused by hemorrhoids. Use hemorrhoid cream if your health care provider approves.  Wear a good support bra to prevent discomfort from breast tenderness.  If you develop varicose veins: ? Wear support pantyhose or compression stockings as told by your healthcare provider. ? Elevate your feet for 15 minutes, 3-4 times a day. Prenatal care  Write down your questions. Take them to your prenatal visits.  Keep all your prenatal visits as told by your health care provider. This is important. Safety  Wear your seat belt at  all times when driving.  Make a list of emergency phone numbers, including numbers for family, friends, the hospital, and police and fire departments. General instructions  Avoid cat litter boxes and soil used by cats. These carry germs that can cause birth defects in the baby. If you have a cat, ask someone to clean the litter box for you.  Do not travel far distances unless it is absolutely necessary and only with the approval of your health care provider.  Do not use hot tubs, steam rooms, or saunas.  Do not drink alcohol.  Do not use any products that contain nicotine or tobacco, such as cigarettes and e-cigarettes. If you need help quitting, ask your health care provider.  Do not use any medicinal herbs or unprescribed drugs. These chemicals affect the formation and growth of the baby.  Do not douche or use tampons or scented sanitary pads.  Do not cross your legs for long periods of time.  To prepare for the arrival of your baby: ? Take prenatal classes to understand, practice, and ask questions about labor and delivery. ? Make a trial run to the hospital. ? Visit the hospital and tour the maternity area. ? Arrange for maternity or paternity leave through employers. ? Arrange for family and friends to take care of pets while you are in the hospital. ? Purchase a rear-facing car seat and make sure you know how to install it in your car. ? Pack your hospital bag. ? Prepare the baby's nursery. Make sure to remove all pillows and stuffed animals from the baby's crib to prevent suffocation.  Visit your dentist if you have not gone during your pregnancy. Use a soft toothbrush to brush your teeth and be gentle when you floss. Contact a health care provider if:  You are unsure if you are in labor or if your water has broken.  You become dizzy.  You have mild pelvic cramps, pelvic pressure, or nagging pain in your abdominal area.  You have lower back pain.  You have persistent  nausea, vomiting, or diarrhea.  You have an unusual or bad smelling vaginal discharge.  You have pain when you urinate. Get help right away if:  Your water breaks before 37 weeks.  You have regular contractions less than 5 minutes apart before 37 weeks.  You have a fever.  You are leaking fluid from your vagina.  You have spotting or bleeding from your vagina.  You have severe abdominal pain or cramping.  You have rapid weight loss or weight gain.    You have shortness of breath with chest pain.  You notice sudden or extreme swelling of your face, hands, ankles, feet, or legs.  Your baby makes fewer than 10 movements in 2 hours.  You have severe headaches that do not go away when you take medicine.  You have vision changes. Summary  The third trimester is from week 28 through week 40, months 7 through 9. The third trimester is a time when the unborn baby (fetus) is growing rapidly.  During the third trimester, your discomfort may increase as you and your baby continue to gain weight. You may have abdominal, leg, and back pain, sleeping problems, and an increased need to urinate.  During the third trimester your breasts will keep growing and they will continue to become tender. A yellow fluid (colostrum) may leak from your breasts. This is the first milk you are producing for your baby.  False labor is a condition in which you feel small, irregular tightenings of the muscles in the womb (contractions) that eventually go away. These are called Braxton Hicks contractions. Contractions may last for hours, days, or even weeks before true labor sets in.  Signs of labor can include: abdominal cramps; regular contractions that start at 10 minutes apart and become stronger and more frequent with time; watery or bloody mucus discharge that comes from the vagina; increased pelvic pressure and dull back pain; and leaking of amniotic fluid. This information is not intended to replace advice  given to you by your health care provider. Make sure you discuss any questions you have with your health care provider. Document Released: 08/03/2001 Document Revised: 01/15/2016 Document Reviewed: 10/10/2012 Elsevier Interactive Patient Education  2017 [employer]  

## 2016-11-09 ENCOUNTER — Other Ambulatory Visit: Payer: Self-pay | Admitting: *Deleted

## 2016-11-09 ENCOUNTER — Telehealth: Payer: Self-pay | Admitting: *Deleted

## 2016-11-09 DIAGNOSIS — O24410 Gestational diabetes mellitus in pregnancy, diet controlled: Secondary | ICD-10-CM

## 2016-11-09 LAB — CERVICOVAGINAL ANCILLARY ONLY
Chlamydia: NEGATIVE
Neisseria Gonorrhea: NEGATIVE

## 2016-11-09 NOTE — Telephone Encounter (Signed)
Patient notified and new order put in

## 2016-11-09 NOTE — Telephone Encounter (Signed)
Patient is calling to let us know that her BPP was normal on Monday after she left the office and was sent over to the hospital. They did not want to tell her not to come on Thursday so they told her to call her provider and let her provider decide. So the question is: Do you want her to get the BPP as scheduled on Thursday or does she just need NST? Your sound judgement is appreciated.

## 2016-11-09 NOTE — Telephone Encounter (Signed)
NST only

## 2016-11-10 ENCOUNTER — Encounter: Payer: Self-pay | Admitting: Obstetrics and Gynecology

## 2016-11-10 DIAGNOSIS — O99820 Streptococcus B carrier state complicating pregnancy: Secondary | ICD-10-CM | POA: Insufficient documentation

## 2016-11-10 LAB — STREP GP B NAA: Strep Gp B NAA: POSITIVE — AB

## 2016-11-11 ENCOUNTER — Ambulatory Visit (HOSPITAL_COMMUNITY): Payer: No Typology Code available for payment source

## 2016-11-11 ENCOUNTER — Encounter (HOSPITAL_COMMUNITY): Payer: Self-pay

## 2016-11-11 ENCOUNTER — Ambulatory Visit (HOSPITAL_COMMUNITY)
Admission: RE | Admit: 2016-11-11 | Discharge: 2016-11-11 | Disposition: A | Discharge status: Home / Self Care | Payer: Medicaid Other | Source: Ambulatory Visit | Attending: Obstetrics and Gynecology | Admitting: Obstetrics and Gynecology

## 2016-11-11 VITALS — BP 139/78 | HR 83 | Wt 239.8 lb

## 2016-11-11 DIAGNOSIS — O10919 Unspecified pre-existing hypertension complicating pregnancy, unspecified trimester: Secondary | ICD-10-CM | POA: Diagnosis present

## 2016-11-11 NOTE — Procedures (Signed)
Jill Madden 08/12/1996 4456w5d  Fetus A Non-Stress Test Interpretation for 11/11/16  Indication: Chronic Hypertenstion  Fetal Heart Rate A Mode: External Baseline Rate (A): 150 bpm Variability: Moderate Accelerations: 15 x 15 Decelerations: None  Uterine Activity Mode: Toco Contraction Frequency (min): none noted  Interpretation (Fetal Testing) Nonstress Test Interpretation: Reactive Comments: FHR tracing rev'd by Dr. Sherrie Georgeecker

## 2016-11-13 ENCOUNTER — Other Ambulatory Visit: Payer: Self-pay | Admitting: Certified Nurse Midwife

## 2016-11-13 DIAGNOSIS — O24419 Gestational diabetes mellitus in pregnancy, unspecified control: Secondary | ICD-10-CM

## 2016-11-15 ENCOUNTER — Encounter: Payer: Medicaid Other | Admitting: Obstetrics and Gynecology

## 2016-11-15 ENCOUNTER — Ambulatory Visit (INDEPENDENT_AMBULATORY_CARE_PROVIDER_SITE_OTHER): Payer: Medicaid Other | Admitting: Obstetrics and Gynecology

## 2016-11-15 VITALS — BP 137/89 | HR 84 | Wt 238.0 lb

## 2016-11-15 DIAGNOSIS — O10913 Unspecified pre-existing hypertension complicating pregnancy, third trimester: Secondary | ICD-10-CM

## 2016-11-15 DIAGNOSIS — O0990 Supervision of high risk pregnancy, unspecified, unspecified trimester: Secondary | ICD-10-CM

## 2016-11-15 DIAGNOSIS — O99820 Streptococcus B carrier state complicating pregnancy: Secondary | ICD-10-CM

## 2016-11-15 DIAGNOSIS — O24419 Gestational diabetes mellitus in pregnancy, unspecified control: Secondary | ICD-10-CM

## 2016-11-15 DIAGNOSIS — O10919 Unspecified pre-existing hypertension complicating pregnancy, unspecified trimester: Secondary | ICD-10-CM

## 2016-11-15 NOTE — Progress Notes (Signed)
   PRENATAL VISIT NOTE  Subjective:  Jill Madden is a 21 y.o. G2P0010 at 1181w2d being seen today for ongoing prenatal care.  She is currently monitored for the following issues for this high-risk pregnancy and has Supervision of high risk pregnancy, antepartum; Chronic hypertension during pregnancy; GDM (gestational diabetes mellitus); and GBS (group B Streptococcus carrier), +RV culture, currently pregnant on her problem list.  Patient reports no complaints.  Contractions: Not present. Vag. Bleeding: None.  Movement: Present. Denies leaking of fluid.   The following portions of the patient's history were reviewed and updated as appropriate: allergies, current medications, past family history, past medical history, past social history, past surgical history and problem list. Problem list updated.  Objective:   Vitals:   11/15/16 0831  BP: 137/89  Pulse: 84  Weight: 238 lb (108 kg)    Fetal Status: Fetal Heart Rate (bpm): NST   Movement: Present     General:  Alert, oriented and cooperative. Patient is in no acute distress.  Skin: Skin is warm and dry. No rash noted.   Cardiovascular: Normal heart rate noted  Respiratory: Normal respiratory effort, no problems with respiration noted  Abdomen: Soft, gravid, appropriate for gestational age. Pain/Pressure: Absent     Pelvic:  Cervical exam deferred        Extremities: Normal range of motion.  Edema: None  Mental Status: Normal mood and affect. Normal behavior. Normal judgment and thought content.   Assessment and Plan:  Pregnancy: G2P0010 at 9281w2d  1. Chronic hypertension during pregnancy BP well controlled Continue Labetalol   2. Gestational diabetes mellitus (GDM) in third trimester, gestational diabetes method of control unspecified Patient did not bring CBG log but reports values are all within the normal range Follow up growth with BPP on 3/29 NST reviewed and reactive with baseline 130, mod variability, + accels, no  decels   3. Supervision of high risk pregnancy, antepartum Patient is doing well without complaints Patient scheduled for IOL on 4/7  4. GBS (group B Streptococcus carrier), +RV culture, currently pregnant Will provide prophylaxis in labor  Term labor symptoms and general obstetric precautions including but not limited to vaginal bleeding, contractions, leaking of fluid and fetal movement were reviewed in detail with the patient. Please refer to After Visit Summary for other counseling recommendations.  No Follow-up on file.   Catalina AntiguaPeggy Seleta Hovland, MD

## 2016-11-16 ENCOUNTER — Other Ambulatory Visit: Payer: Self-pay | Admitting: Certified Nurse Midwife

## 2016-11-16 DIAGNOSIS — Z3A37 37 weeks gestation of pregnancy: Secondary | ICD-10-CM

## 2016-11-16 DIAGNOSIS — Z3A38 38 weeks gestation of pregnancy: Secondary | ICD-10-CM

## 2016-11-18 ENCOUNTER — Ambulatory Visit (HOSPITAL_COMMUNITY): Payer: No Typology Code available for payment source

## 2016-11-18 ENCOUNTER — Other Ambulatory Visit: Payer: Self-pay | Admitting: Obstetrics and Gynecology

## 2016-11-18 ENCOUNTER — Other Ambulatory Visit: Payer: Self-pay | Admitting: Certified Nurse Midwife

## 2016-11-18 ENCOUNTER — Ambulatory Visit (HOSPITAL_COMMUNITY)
Admission: RE | Admit: 2016-11-18 | Discharge: 2016-11-18 | Disposition: A | Discharge status: Home / Self Care | Payer: Medicaid Other | Source: Ambulatory Visit | Attending: Certified Nurse Midwife | Admitting: Certified Nurse Midwife

## 2016-11-18 ENCOUNTER — Encounter (HOSPITAL_COMMUNITY): Payer: Self-pay

## 2016-11-18 DIAGNOSIS — O24419 Gestational diabetes mellitus in pregnancy, unspecified control: Secondary | ICD-10-CM

## 2016-11-18 DIAGNOSIS — I10 Essential (primary) hypertension: Secondary | ICD-10-CM

## 2016-11-18 DIAGNOSIS — Z3A37 37 weeks gestation of pregnancy: Secondary | ICD-10-CM

## 2016-11-18 DIAGNOSIS — O3663X0 Maternal care for excessive fetal growth, third trimester, not applicable or unspecified: Secondary | ICD-10-CM

## 2016-11-18 DIAGNOSIS — O10919 Unspecified pre-existing hypertension complicating pregnancy, unspecified trimester: Secondary | ICD-10-CM

## 2016-11-18 DIAGNOSIS — O10013 Pre-existing essential hypertension complicating pregnancy, third trimester: Secondary | ICD-10-CM | POA: Diagnosis present

## 2016-11-18 DIAGNOSIS — O3660X0 Maternal care for excessive fetal growth, unspecified trimester, not applicable or unspecified: Secondary | ICD-10-CM | POA: Insufficient documentation

## 2016-11-18 DIAGNOSIS — O24410 Gestational diabetes mellitus in pregnancy, diet controlled: Secondary | ICD-10-CM

## 2016-11-22 ENCOUNTER — Telehealth (HOSPITAL_COMMUNITY): Payer: Self-pay | Admitting: *Deleted

## 2016-11-22 ENCOUNTER — Ambulatory Visit (INDEPENDENT_AMBULATORY_CARE_PROVIDER_SITE_OTHER): Payer: Medicaid Other | Admitting: Obstetrics & Gynecology

## 2016-11-22 ENCOUNTER — Encounter (HOSPITAL_COMMUNITY): Payer: Self-pay | Admitting: *Deleted

## 2016-11-22 VITALS — BP 136/90 | HR 86 | Temp 98.0°F | Wt 242.5 lb

## 2016-11-22 DIAGNOSIS — O99820 Streptococcus B carrier state complicating pregnancy: Secondary | ICD-10-CM

## 2016-11-22 DIAGNOSIS — O24410 Gestational diabetes mellitus in pregnancy, diet controlled: Secondary | ICD-10-CM

## 2016-11-22 DIAGNOSIS — O10919 Unspecified pre-existing hypertension complicating pregnancy, unspecified trimester: Secondary | ICD-10-CM

## 2016-11-22 DIAGNOSIS — O0990 Supervision of high risk pregnancy, unspecified, unspecified trimester: Secondary | ICD-10-CM

## 2016-11-22 DIAGNOSIS — O10913 Unspecified pre-existing hypertension complicating pregnancy, third trimester: Secondary | ICD-10-CM

## 2016-11-22 DIAGNOSIS — O3663X0 Maternal care for excessive fetal growth, third trimester, not applicable or unspecified: Secondary | ICD-10-CM

## 2016-11-22 NOTE — Progress Notes (Addendum)
   PRENATAL VISIT NOTE  Subjective:  Jill Madden is a 21 y.o. G2P0010 at [redacted]w[redacted]d being seen today for ongoing prenatal care.  She is currently monitored for the following issues for this high-risk pregnancy and has Supervision of high risk pregnancy, antepartum; Chronic hypertension during pregnancy; GDM (gestational diabetes mellitus); GBS (group B Streptococcus carrier), +RV culture, currently pregnant; and LGA (large for gestational age) fetus affecting management of mother on her problem list.  Patient reports intermittent BLE edema, sometimes L>R.  Contractions: Not present. Vag. Bleeding: None.  Movement: Present. Denies leaking of fluid.   The following portions of the patient's history were reviewed and updated as appropriate: allergies, current medications, past family history, past medical history, past social history, past surgical history and problem list. Problem list updated.  Objective:   Vitals:   11/22/16 0849  BP: 136/90  Pulse: 86  Temp: 98 F (36.7 C)  Weight: 242 lb 8 oz (110 kg)    Fetal Status: Fetal Heart Rate (bpm): NST Fundal Height: 39 cm Movement: Present     General:  Alert, oriented and cooperative. Patient is in no acute distress.  Skin: Skin is warm and dry. No rash noted.   Cardiovascular: Normal heart rate noted  Respiratory: Normal respiratory effort, no problems with respiration noted  Abdomen: Soft, gravid, appropriate for gestational age. Pain/Pressure: Absent     Pelvic:  Cervical exam performed        Extremities: Normal range of motion.  Edema: Trace. Symmetric. No palpable cords, no erythema. Negative Homan's sign.  Mental Status: Normal mood and affect. Normal behavior. Normal judgment and thought content.   Assessment and Plan:  Pregnancy: G2P0010 at [redacted]w[redacted]d  1. Chronic hypertension during pregnancy Stable BP on Labetalol. Reactive NST today, BPP 8/8 last week. Repeat BPP on 11/25/16. IOL scheduled at 39 weeks.  2. Diet controlled  gestational diabetes mellitus (GDM) in third trimester Stable BS on review today.    3. Excessive fetal growth affecting management of pregnancy in third trimester, single or unspecified fetus LGA>90%@[redacted]w[redacted]d  8 lb (3630g)   4. GBS (group B Streptococcus carrier), +RV culture, currently pregnant Will treat in labor  5. Supervision of high risk pregnancy, antepartum Patient advised to call/come back with worsening unilateral edema as she may need further evaluation. She was offered LE dopplers today, but she declined. Unremarkable examination today.    Scheduled for IOL on 11/27/16. Term labor symptoms and general obstetric precautions including but not limited to vaginal bleeding, contractions, leaking of fluid and fetal movement were reviewed in detail with the patient. Please refer to After Visit Summary for other counseling recommendations.  Return for Postpartum check and 2 hr GTT; progestin IUD placement.   Tereso Newcomer, MD

## 2016-11-22 NOTE — Telephone Encounter (Signed)
Preadmission screen  

## 2016-11-22 NOTE — Patient Instructions (Addendum)
Return to clinic for any scheduled appointments or obstetric concerns, or go to [facility] for evaluation   Labor Induction Labor induction is when steps are taken to cause a pregnant woman to begin the labor process. Most women go into labor on their own between 37 weeks and 42 weeks of the pregnancy. When this does not happen or when there is a medical need, methods may be used to induce labor. Labor induction causes a pregnant woman's uterus to contract. It also causes the cervix to soften (ripen), open (dilate), and thin out (efface). Usually, labor is not induced before 39 weeks of the pregnancy unless there is a problem with the baby or mother. Before inducing labor, your health care provider will consider a number of factors, including the following:  The medical condition of you and the baby.  How many weeks along you are.  The status of the baby's lung maturity.  The condition of the cervix.  The position of the baby. What are the reasons for labor induction? Labor may be induced for the following reasons:  The health of the baby or mother is at risk.  The pregnancy is overdue by 1 week or more.  The water breaks but labor does not start on its own.  The mother has a health condition or serious illness, such as high blood pressure, infection, placental abruption, or diabetes.  The amniotic fluid amounts are low around the baby.  The baby is distressed. Convenience or wanting the baby to be born on a certain date is not a reason for inducing labor. What methods are used for labor induction? Several methods of labor induction may be used, such as:  Prostaglandin medicine. This medicine causes the cervix to dilate and ripen. The medicine will also start contractions. It can be taken by mouth or by inserting a suppository into the vagina.  Inserting a thin tube (catheter) with a balloon on the end into the vagina to dilate the cervix. Once inserted, the balloon is expanded with  water, which causes the cervix to open.  Stripping the membranes. Your health care provider separates amniotic sac tissue from the cervix, causing the cervix to be stretched and causing the release of a hormone called progesterone. This may cause the uterus to contract. It is often done during an office visit. You will be sent home to wait for the contractions to begin. You will then come in for an induction.  Breaking the water. Your health care provider makes a hole in the amniotic sac using a small instrument. Once the amniotic sac breaks, contractions should begin. This may still take hours to see an effect.  Medicine to trigger or strengthen contractions. This medicine is given through an IV access tube inserted into a vein in your arm. All of the methods of induction, besides stripping the membranes, will be done in the hospital. Induction is done in the hospital so that you and the baby can be carefully monitored. How long does it take for labor to be induced? Some inductions can take up to 2-3 days. Depending on the cervix, it usually takes less time. It takes longer when you are induced early in the pregnancy or if this is your first pregnancy. If a mother is still pregnant and the induction has been going on for 2-3 days, either the mother will be sent home or a cesarean delivery will be needed. What are the risks associated with labor induction? Some of the risks of induction include:  Changes in fetal heart rate, such as too high, too low, or erratic.  Fetal distress.  Chance of infection for the mother and baby.  Increased chance of having a cesarean delivery.  Breaking off (abruption) of the placenta from the uterus (rare).  Uterine rupture (very rare). When induction is needed for medical reasons, the benefits of induction may outweigh the risks. What are some reasons for not inducing labor? Labor induction should not be done if:  It is shown that your baby does not  tolerate labor.  You have had previous surgeries on your uterus, such as a myomectomy or the removal of fibroids.  Your placenta lies very low in the uterus and blocks the opening of the cervix (placenta previa).  Your baby is not in a head-down position.  The umbilical cord drops down into the birth canal in front of the baby. This could cut off the baby's blood and oxygen supply.  You have had a previous cesarean delivery.  There are unusual circumstances, such as the baby being extremely premature. This information is not intended to replace advice given to you by your health care provider. Make sure you discuss any questions you have with your health care provider. Document Released: 12/29/2006 Document Revised: 01/15/2016 Document Reviewed: 03/08/2013 Elsevier Interactive Patient Education  2017 [employer]   Intrauterine Device Information An intrauterine device (IUD) is inserted into your uterus to prevent pregnancy. There are two types of IUDs available:  Copper IUD-This type of IUD is wrapped in copper wire and is placed inside the uterus. Copper makes the uterus and fallopian tubes produce a fluid that kills sperm. The copper IUD can stay in place for 10 years.  Hormone IUD-This type of IUD contains the hormone progestin (synthetic progesterone). The hormone thickens the cervical mucus and prevents sperm from entering the uterus. It also thins the uterine lining to prevent implantation of a fertilized egg. The hormone can weaken or kill the sperm that get into the uterus. One type of hormone IUD can stay in place for 5 years, and another type can stay in place for 3 years. Your health care provider will make sure you are a good candidate for a contraceptive IUD. Discuss with your health care provider the possible side effects. Advantages of an intrauterine device  IUDs are highly effective, reversible, long acting, and low maintenance.  There are no estrogen-related side  effects.  An IUD can be used when breastfeeding.  IUDs are not associated with weight gain.  The copper IUD works immediately after insertion.  The hormone IUD works right away if inserted within 7 days of your period starting. You will need to use a backup method of birth control for 7 days if the hormone IUD is inserted at any other time in your cycle.  The copper IUD does not interfere with your female hormones.  The hormone IUD can make heavy menstrual periods lighter and decrease cramping.  The hormone IUD can be used for 3 or 5 years.  The copper IUD can be used for 10 years. Disadvantages of an intrauterine device  The hormone IUD can be associated with irregular bleeding patterns.  The copper IUD can make your menstrual flow heavier and more painful.  You may experience cramping and vaginal bleeding after insertion. This information is not intended to replace advice given to you by your health care provider. Make sure you discuss any questions you have with your health care provider. Document Released: 07/13/2004 Document Revised:  01/15/2016 Document Reviewed: 01/28/2013 Elsevier Interactive Patient Education  2017 Else[employer] Inc.    Intrauterine Device Insertion An intrauterine device (IUD) is a medical device that gets inserted into the uterus to prevent pregnancy. It is a small, T-shaped device that has one or two nylon strings hanging down from it. The strings hang out of the lower part of the uterus (cervix) to allow for future IUD removal. There are two types of IUDs available:  Copper IUD. This type of IUD has copper wire wrapped around it. Copper makes the uterus and fallopian tubes produce a fluid that kills sperm. A copper IUD may last up to 10 years.  Hormone IUD. This type of IUD is made of plastic and contains the hormone progestin (synthetic progesterone). The hormone thickens mucus in the cervix and prevents sperm from entering the uterus. It also thins the  uterine lining to prevent implantation of a fertilized egg. The hormone can weaken or kill the sperm that get into the uterus. A hormone IUD may last 3-5 years. Tell a health care provider about:  Any allergies you have.  All medicines you are taking, including vitamins, herbs, eye drops, creams, and over-the-counter medicines.  Any problems you or family members have had with anesthetic medicines.  Any blood disorders you have.  Any surgeries you have had.  Any medical conditions you have, including any STIs (sexually transmitted infections) you may have.  Whether you are pregnant or may be pregnant. What are the risks? Generally, this is a safe procedure. However, problems may occur, including:  Infection.  Bleeding.  Allergic reactions to medicines.  Accidental puncture (perforation) of the uterus, or damage to other structures or organs.  Accidental placement of the IUD either in the muscle layer of the uterus (myometrium) or outside the uterus.  The IUD falling out of the uterus (expulsion). This is more common among women who have recently had a child.  Pregnancy that happens in the fallopian tube (ectopic pregnancy).  Infection of the uterus and fallopian tubes (pelvic inflammatory disease). What happens before the procedure?  Schedule the IUD insertion for when you will have your menstrual period or right after, to make sure you are not pregnant. Placement of the IUD is better tolerated shortly after a menstrual cycle.  Follow instructions from your health care provider about eating or drinking restrictions.  Ask your health care provider about changing or stopping your regular medicines. This is especially important if you are taking diabetes medicines or blood thinners.  You may get a pain reliever to take before the procedure.  You may have tests for:  Pregnancy. A pregnancy test involves having a urine sample taken.  STIs. Placing an IUD in someone who has  an STI can make the infection worse.  Cervical cancer. You may have a Pap test to check for this type of cancer. This means collecting cells from your cervix to be examined under a microscope.  You may have a physical exam to determine the size and position of your uterus. The procedure may vary among health care providers and hospitals. What happens during the procedure?  A tool (speculum) will be placed in your vagina and widened so that your health care provider can see your cervix.  Medicine may be applied to your cervix to help lower your risk of infection (antiseptic medicine).  You may be given an anesthetic medicine to numb each side of your cervix (intracervical block or paracervical block). This medicine is usually given  by an injection into the cervix.  A tool (uterine sound) will be inserted into your uterus to determine the length of your uterus and the direction that your uterus may be tilted.  A slim instrument or tube (IUD inserter) that holds the IUD will be inserted into your vagina, through your cervical canal, and into your uterus.  The IUD will be placed in the uterus, and the IUD inserter will be removed.  The strings that are attached to the IUD will be trimmed so that they lie just below the cervix. The procedure may vary among health care providers and hospitals. What happens after the procedure?  You may have bleeding after the procedure. This is normal. It varies from light bleeding (spotting) for a few days to menstrual-like bleeding.  You may have cramping and pain.  You may feel dizzy or light-headed.  You may have lower back pain. Summary  An intrauterine device (IUD) is a small, T-shaped device that has one or two nylon strings hanging down from it.  Two types of IUDs are available. You may have a copper IUD or a hormone IUD.  Schedule the IUD insertion for when you will have your menstrual period or right after, to make sure you are not pregnant.  Placement of the IUD is better tolerated shortly after a menstrual cycle.  You may have bleeding after the procedure. This is normal. It varies from light spotting for a few days to menstrual-like bleeding. This information is not intended to replace advice given to you by your health care provider. Make sure you discuss any questions you have with your health care provider. Document Released: 04/07/2011 Document Revised: 06/30/2016 Document Reviewed: 06/30/2016 Elsevier Interactive Patient Education  2017 [employer].

## 2016-11-25 ENCOUNTER — Ambulatory Visit (HOSPITAL_COMMUNITY): Payer: No Typology Code available for payment source

## 2016-11-25 ENCOUNTER — Encounter (HOSPITAL_COMMUNITY): Payer: Self-pay

## 2016-11-25 ENCOUNTER — Ambulatory Visit (HOSPITAL_COMMUNITY)
Admission: RE | Admit: 2016-11-25 | Discharge: 2016-11-25 | Disposition: A | Discharge status: Home / Self Care | Payer: Medicaid Other | Source: Ambulatory Visit | Attending: Certified Nurse Midwife | Admitting: Certified Nurse Midwife

## 2016-11-25 DIAGNOSIS — O24419 Gestational diabetes mellitus in pregnancy, unspecified control: Secondary | ICD-10-CM | POA: Diagnosis present

## 2016-11-25 DIAGNOSIS — O10919 Unspecified pre-existing hypertension complicating pregnancy, unspecified trimester: Secondary | ICD-10-CM | POA: Diagnosis present

## 2016-11-25 DIAGNOSIS — Z3A38 38 weeks gestation of pregnancy: Secondary | ICD-10-CM

## 2016-11-25 DIAGNOSIS — Z3A37 37 weeks gestation of pregnancy: Secondary | ICD-10-CM | POA: Insufficient documentation

## 2016-11-27 ENCOUNTER — Inpatient Hospital Stay (HOSPITAL_COMMUNITY)
Admission: RE | Admit: 2016-11-27 | Discharge: 2016-12-01 | DRG: 766 | Disposition: A | Discharge status: Home / Self Care | Payer: Medicaid Other | Source: Ambulatory Visit | Attending: Obstetrics and Gynecology | Admitting: Obstetrics and Gynecology

## 2016-11-27 ENCOUNTER — Encounter (HOSPITAL_COMMUNITY): Payer: Self-pay

## 2016-11-27 VITALS — BP 147/88 | HR 70 | Temp 98.1°F | Resp 20 | Ht 67.5 in | Wt 243.0 lb

## 2016-11-27 DIAGNOSIS — Z87891 Personal history of nicotine dependence: Secondary | ICD-10-CM | POA: Diagnosis not present

## 2016-11-27 DIAGNOSIS — Z3A39 39 weeks gestation of pregnancy: Secondary | ICD-10-CM | POA: Diagnosis not present

## 2016-11-27 DIAGNOSIS — Z823 Family history of stroke: Secondary | ICD-10-CM | POA: Diagnosis not present

## 2016-11-27 DIAGNOSIS — O114 Pre-existing hypertension with pre-eclampsia, complicating childbirth: Principal | ICD-10-CM | POA: Diagnosis present

## 2016-11-27 DIAGNOSIS — O99824 Streptococcus B carrier state complicating childbirth: Secondary | ICD-10-CM | POA: Diagnosis present

## 2016-11-27 DIAGNOSIS — Z833 Family history of diabetes mellitus: Secondary | ICD-10-CM | POA: Diagnosis not present

## 2016-11-27 DIAGNOSIS — O99820 Streptococcus B carrier state complicating pregnancy: Secondary | ICD-10-CM

## 2016-11-27 DIAGNOSIS — Z98891 History of uterine scar from previous surgery: Secondary | ICD-10-CM

## 2016-11-27 DIAGNOSIS — O24429 Gestational diabetes mellitus in childbirth, unspecified control: Secondary | ICD-10-CM | POA: Diagnosis not present

## 2016-11-27 DIAGNOSIS — O1002 Pre-existing essential hypertension complicating childbirth: Secondary | ICD-10-CM | POA: Diagnosis present

## 2016-11-27 DIAGNOSIS — O24425 Gestational diabetes mellitus in childbirth, controlled by oral hypoglycemic drugs: Secondary | ICD-10-CM | POA: Diagnosis present

## 2016-11-27 DIAGNOSIS — O24419 Gestational diabetes mellitus in pregnancy, unspecified control: Secondary | ICD-10-CM | POA: Diagnosis present

## 2016-11-27 DIAGNOSIS — O324XX0 Maternal care for high head at term, not applicable or unspecified: Secondary | ICD-10-CM | POA: Diagnosis present

## 2016-11-27 DIAGNOSIS — O3663X0 Maternal care for excessive fetal growth, third trimester, not applicable or unspecified: Secondary | ICD-10-CM | POA: Diagnosis present

## 2016-11-27 DIAGNOSIS — O10919 Unspecified pre-existing hypertension complicating pregnancy, unspecified trimester: Secondary | ICD-10-CM | POA: Diagnosis present

## 2016-11-27 DIAGNOSIS — O0990 Supervision of high risk pregnancy, unspecified, unspecified trimester: Secondary | ICD-10-CM

## 2016-11-27 LAB — COMPREHENSIVE METABOLIC PANEL
ALBUMIN: 3.1 g/dL — AB (ref 3.5–5.0)
ALK PHOS: 144 U/L — AB (ref 38–126)
ALK PHOS: 177 U/L — AB (ref 38–126)
ALT: 15 U/L (ref 14–54)
ALT: 16 U/L (ref 14–54)
ALT: 16 U/L (ref 14–54)
ANION GAP: 10 (ref 5–15)
AST: 16 U/L (ref 15–41)
AST: 15 U/L (ref 15–41)
AST: 14 U/L — ABNORMAL LOW (ref 15–41)
Albumin: 3.1 g/dL — ABNORMAL LOW (ref 3.5–5.0)
Albumin: 3.3 g/dL — ABNORMAL LOW (ref 3.5–5.0)
Alkaline Phosphatase: 173 U/L — ABNORMAL HIGH (ref 38–126)
Anion gap: 10 (ref 5–15)
Anion gap: 8 (ref 5–15)
BILIRUBIN TOTAL: 0.7 mg/dL (ref 0.3–1.2)
BUN: 5 mg/dL — AB (ref 6–20)
BUN: 6 mg/dL (ref 6–20)
CALCIUM: 8.7 mg/dL — AB (ref 8.9–10.3)
CHLORIDE: 105 mmol/L (ref 101–111)
CO2: 20 mmol/L — AB (ref 22–32)
CO2: 21 mmol/L — ABNORMAL LOW (ref 22–32)
CO2: 20 mmol/L — ABNORMAL LOW (ref 22–32)
CREATININE: 0.52 mg/dL (ref 0.44–1.00)
CREATININE: 0.55 mg/dL (ref 0.44–1.00)
CREATININE: 0.65 mg/dL (ref 0.44–1.00)
Calcium: 8.8 mg/dL — ABNORMAL LOW (ref 8.9–10.3)
Calcium: 8.6 mg/dL — ABNORMAL LOW (ref 8.9–10.3)
Chloride: 107 mmol/L (ref 101–111)
Chloride: 106 mmol/L (ref 101–111)
GFR calc Af Amer: >60 mL/min (ref >60)
GFR calc Af Amer: >60 mL/min (ref >60)
GFR calc non Af Amer: >60 mL/min (ref >60)
GFR calc non Af Amer: >60 mL/min (ref >60)
GFR calc non Af Amer: >60 mL/min (ref >60)
GLUCOSE: 99 mg/dL (ref 65–99)
Glucose, Bld: 156 mg/dL — ABNORMAL HIGH (ref 65–99)
Glucose, Bld: 121 mg/dL — ABNORMAL HIGH (ref 65–99)
Potassium: 2.8 mmol/L — ABNORMAL LOW (ref 3.5–5.1)
Potassium: 2.8 mmol/L — ABNORMAL LOW (ref 3.5–5.1)
Potassium: 2.8 mmol/L — ABNORMAL LOW (ref 3.5–5.1)
SODIUM: 136 mmol/L (ref 135–145)
Sodium: 135 mmol/L (ref 135–145)
Sodium: 136 mmol/L (ref 135–145)
TOTAL PROTEIN: 6.4 g/dL — AB (ref 6.5–8.1)
Total Bilirubin: 0.9 mg/dL (ref 0.3–1.2)
Total Bilirubin: 0.7 mg/dL (ref 0.3–1.2)
Total Protein: 6.5 g/dL (ref 6.5–8.1)
Total Protein: 6.4 g/dL — ABNORMAL LOW (ref 6.5–8.1)

## 2016-11-27 LAB — GLUCOSE, CAPILLARY
GLUCOSE-CAPILLARY: 142 mg/dL — AB (ref 65–99)
GLUCOSE-CAPILLARY: 109 mg/dL — AB (ref 65–99)
GLUCOSE-CAPILLARY: 76 mg/dL (ref 65–99)
Glucose-Capillary: 179 mg/dL — ABNORMAL HIGH (ref 65–99)

## 2016-11-27 LAB — CBC
HCT: 31.8 % — ABNORMAL LOW (ref 36.0–46.0)
HCT: 33.3 % — ABNORMAL LOW (ref 36.0–46.0)
HEMATOCRIT: 33.3 % — AB (ref 36.0–46.0)
HEMOGLOBIN: 11.2 g/dL — AB (ref 12.0–15.0)
Hemoglobin: 11.1 g/dL — ABNORMAL LOW (ref 12.0–15.0)
Hemoglobin: 10.6 g/dL — ABNORMAL LOW (ref 12.0–15.0)
MCH: 29.1 pg (ref 26.0–34.0)
MCH: 29 pg (ref 26.0–34.0)
MCH: 29.6 pg (ref 26.0–34.0)
MCHC: 33.3 g/dL (ref 30.0–36.0)
MCHC: 33.3 g/dL (ref 30.0–36.0)
MCHC: 33.6 g/dL (ref 30.0–36.0)
MCV: 87.2 fL (ref 78.0–100.0)
MCV: 86.9 fL (ref 78.0–100.0)
MCV: 87.9 fL (ref 78.0–100.0)
PLATELETS: 294 10*3/uL (ref 150–400)
PLATELETS: 274 10*3/uL (ref 150–400)
PLATELETS: 293 10*3/uL (ref 150–400)
RBC: 3.82 MIL/uL — AB (ref 3.87–5.11)
RBC: 3.66 MIL/uL — ABNORMAL LOW (ref 3.87–5.11)
RBC: 3.79 MIL/uL — AB (ref 3.87–5.11)
RDW: 15.5 % (ref 11.5–15.5)
RDW: 15.4 % (ref 11.5–15.5)
RDW: 15.6 % — ABNORMAL HIGH (ref 11.5–15.5)
WBC: 10.2 10*3/uL (ref 4.0–10.5)
WBC: 8.3 10*3/uL (ref 4.0–10.5)
WBC: 13.4 10*3/uL — ABNORMAL HIGH (ref 4.0–10.5)

## 2016-11-27 LAB — TYPE AND SCREEN
ABO/RH(D): AB POS
Antibody Screen: NEGATIVE

## 2016-11-27 LAB — RPR: RPR Ser Ql: NONREACTIVE

## 2016-11-27 LAB — PROTEIN / CREATININE RATIO, URINE
CREATININE, URINE: 36 mg/dL
Protein Creatinine Ratio: 0.28 mg/mg{Cre} — ABNORMAL HIGH (ref 0.00–0.15)
Total Protein, Urine: 10 mg/dL

## 2016-11-27 LAB — ABO/RH: ABO/RH(D): AB POS

## 2016-11-27 MED ORDER — MISOPROSTOL 25 MCG QUARTER TABLET
25.0000 ug | ORAL_TABLET | ORAL | Status: DC | PRN
  Administered 2016-11-27: 25 ug via VAGINAL
  Filled 2016-11-27 (×2): qty 1

## 2016-11-27 MED ORDER — LACTATED RINGERS IV SOLN
INTRAVENOUS | Status: DC
  Administered 2016-11-27 – 2016-11-28 (×5): via INTRAVENOUS
  Administered 2016-11-28: 73 mL/h via INTRAVENOUS

## 2016-11-27 MED ORDER — FENTANYL CITRATE (PF) 100 MCG/2ML IJ SOLN
100.0000 ug | INTRAMUSCULAR | Status: DC | PRN
  Administered 2016-11-27: 100 ug via INTRAVENOUS
  Filled 2016-11-27: qty 2

## 2016-11-27 MED ORDER — ACETAMINOPHEN 325 MG PO TABS
650.0000 mg | ORAL_TABLET | ORAL | Status: DC | PRN
  Administered 2016-11-28: 650 mg via ORAL
  Filled 2016-11-27: qty 2

## 2016-11-27 MED ORDER — ONDANSETRON HCL 4 MG/2ML IJ SOLN
4.0000 mg | Freq: Four times a day (QID) | INTRAMUSCULAR | Status: DC | PRN
  Administered 2016-11-27 – 2016-11-28 (×3): 4 mg via INTRAVENOUS
  Filled 2016-11-27 (×3): qty 2

## 2016-11-27 MED ORDER — FLEET ENEMA 7-19 GM/118ML RE ENEM: 1.0000 | ENEMA | RECTAL | Status: DC | PRN

## 2016-11-27 MED ORDER — OXYCODONE-ACETAMINOPHEN 5-325 MG PO TABS: 1.0000 | ORAL_TABLET | ORAL | Status: DC | PRN

## 2016-11-27 MED ORDER — OXYTOCIN 40 UNITS IN LACTATED RINGERS INFUSION - SIMPLE MED
1.0000 m[IU]/min | INTRAVENOUS | Status: DC
Start: 2016-11-27 — End: 2016-11-29
  Administered 2016-11-27: 2 m[IU]/min via INTRAVENOUS
  Administered 2016-11-28: 4 m[IU]/min via INTRAVENOUS
  Filled 2016-11-27: qty 1000

## 2016-11-27 MED ORDER — HYDRALAZINE HCL 20 MG/ML IJ SOLN: 5.0000 mg | INTRAMUSCULAR | Status: DC | PRN

## 2016-11-27 MED ORDER — LABETALOL HCL 200 MG PO TABS
200.0000 mg | ORAL_TABLET | Freq: Once | ORAL | Status: AC
  Administered 2016-11-27: 200 mg via ORAL
  Filled 2016-11-27: qty 1

## 2016-11-27 MED ORDER — PENICILLIN G POT IN DEXTROSE 60000 UNIT/ML IV SOLN
3.0000 10*6.[IU] | INTRAVENOUS | Status: DC
  Administered 2016-11-27 – 2016-11-28 (×9): 3 10*6.[IU] via INTRAVENOUS
  Filled 2016-11-27 (×10): qty 50

## 2016-11-27 MED ORDER — LABETALOL HCL 200 MG PO TABS
300.0000 mg | ORAL_TABLET | Freq: Three times a day (TID) | ORAL | Status: DC
  Administered 2016-11-27 – 2016-11-28 (×5): 300 mg via ORAL
  Filled 2016-11-27 (×7): qty 1.5

## 2016-11-27 MED ORDER — HYDRALAZINE HCL 20 MG/ML IJ SOLN: 10.0000 mg | Freq: Once | INTRAMUSCULAR | Status: DC | PRN

## 2016-11-27 MED ORDER — LABETALOL HCL 5 MG/ML IV SOLN
20.0000 mg | INTRAVENOUS | Status: DC | PRN
  Administered 2016-11-27: 40 mg via INTRAVENOUS
  Administered 2016-11-27: 20 mg via INTRAVENOUS
  Filled 2016-11-27: qty 8
  Filled 2016-11-27: qty 4

## 2016-11-27 MED ORDER — TERBUTALINE SULFATE 1 MG/ML IJ SOLN: 0.2500 mg | Freq: Once | INTRAMUSCULAR | Status: DC | PRN

## 2016-11-27 MED ORDER — MISOPROSTOL 50MCG HALF TABLET
50.0000 ug | ORAL_TABLET | Freq: Once | ORAL | Status: AC
  Administered 2016-11-27: 50 ug via ORAL
  Filled 2016-11-27: qty 1

## 2016-11-27 MED ORDER — DEXTROSE 5 % IV SOLN
5.0000 10*6.[IU] | Freq: Once | INTRAVENOUS | Status: AC
  Administered 2016-11-27: 5 10*6.[IU] via INTRAVENOUS
  Filled 2016-11-27: qty 5

## 2016-11-27 MED ORDER — MAGNESIUM SULFATE 40 G IN LACTATED RINGERS - SIMPLE
2.0000 g/h | INTRAVENOUS | Status: DC
  Administered 2016-11-28: 2 g/h via INTRAVENOUS
  Filled 2016-11-27: qty 500
  Filled 2016-11-27: qty 40

## 2016-11-27 MED ORDER — LACTATED RINGERS IV SOLN: 500.0000 mL | INTRAVENOUS | Status: DC | PRN

## 2016-11-27 MED ORDER — OXYTOCIN 40 UNITS IN LACTATED RINGERS INFUSION - SIMPLE MED: 2.5000 [IU]/h | INTRAVENOUS | Status: DC

## 2016-11-27 MED ORDER — OXYCODONE-ACETAMINOPHEN 5-325 MG PO TABS: 2.0000 | ORAL_TABLET | ORAL | Status: DC | PRN

## 2016-11-27 MED ORDER — LIDOCAINE HCL (PF) 1 % IJ SOLN: 30.0000 mL | INTRAMUSCULAR | Status: DC | PRN

## 2016-11-27 MED ORDER — SOD CITRATE-CITRIC ACID 500-334 MG/5ML PO SOLN
30.0000 mL | ORAL | Status: DC | PRN
Start: 2016-11-27 — End: 2016-11-29
  Administered 2016-11-28: 30 mL via ORAL
  Filled 2016-11-27: qty 15

## 2016-11-27 MED ORDER — POTASSIUM CHLORIDE CRYS ER 20 MEQ PO TBCR
40.0000 meq | EXTENDED_RELEASE_TABLET | Freq: Two times a day (BID) | ORAL | Status: AC
  Administered 2016-11-27 – 2016-11-28 (×4): 40 meq via ORAL
  Filled 2016-11-27 (×4): qty 2

## 2016-11-27 MED ORDER — MAGNESIUM SULFATE BOLUS VIA INFUSION
4.0000 g | Freq: Once | INTRAVENOUS | Status: AC
  Administered 2016-11-27: 4 g via INTRAVENOUS
  Filled 2016-11-27: qty 500

## 2016-11-27 MED ORDER — OXYTOCIN BOLUS FROM INFUSION: 500.0000 mL | Freq: Once | INTRAVENOUS | Status: DC

## 2016-11-27 NOTE — Progress Notes (Signed)
Patient seen. Patient had another severe range BP at approximately 3PM. Pateitn was started on Magnesium at that time. Since her BP have improved greatly. Repeat labs preformed at 1PM reviewed and WNL aside from low K. Continue repletion as ordered. Foley place. Contracting every 2-3 minutes so no more cytotec placed. Continue expectant management. Repeat labs this PM. Sugars improving.    Ernestina Penna, MD 11/27/16 [phone]

## 2016-11-27 NOTE — Progress Notes (Signed)
Patient continues to have elevated BP's. Recieveing second IV push at this time. Increased PO labetolol. Pr/Cr ratio is 0.28 if pressures remain elevated will have to start magnesium. Continue to monitor.  Ernestina Penna, MD 11/27/16 1044

## 2016-11-27 NOTE — Progress Notes (Signed)
Patient seen. Doing well. BP 150/90 after  IV labetolol. I did order her scheduled PO labetolol as well. CBG q 4 hours. It is elevated this AM secondary to pancakes and syrup. We will continue to follow. As she is in early labor she can eat lunch.   Ernestina Penna, MD 11/27/16 [phone]

## 2016-11-27 NOTE — Progress Notes (Signed)
Labor Progress Note Jill Madden is a 21 y.o. G2P0010 at [redacted]w[redacted]d presented for IOL due to SIPE on cHTN.  S: Lying in bed comfortably. No complaint. Denies headache, vision change, RUQ pain or shortness of breath.   O:  BP (!) 147/86   Pulse 83   Temp 98.4 F (36.9 C) (Oral)   Resp 18   Ht 5' 7.5" (1.715 m)   Wt 243 lb (110.2 kg)   LMP 02/28/2016 (Exact Date)   BMI 37.50 kg/m  EFM: 135/mod var/no decels  CVE: Dilation: 4 Effacement (%): 80 Cervical Position: Posterior Station: -3 Presentation: Vertex Exam by:: Schenk MD   A&P: 21 y.o. G2P0010 [redacted]w[redacted]d admitted for IOL due to SIPE. Also with GDM with LGA >90% at [redacted]w[redacted]d #SIPE: had severe range BP's earlier. Now mild range BP. Mg started. Labetalol protocol and PO labetalol 300 mg three times a day.  #GDM: last two CBG's less than 120. Continue CBG q4h.  #Labor: s/p FB and Cytotec. Starting pitocin.  #Pain: not in pain right now. Discuss later #FWB: CAT-1 #GBS: positive. Continue PCN  Almon Hercules, MD 8:24 PM

## 2016-11-27 NOTE — Anesthesia Pain Management Evaluation Note (Signed)
  [facility] Pain Management Visit Note  Patient: Jill Madden, 21 y.o., female  "Hello I am a member of the anesthesia team at [facility]. We have an anesthesia team available at all times to provide care throughout the hospital, including epidural management and anesthesia for C-section. I don't know your plan for the delivery whether it a natural birth, water birth, IV sedation, nitrous supplementation, doula or epidural, but we want to meet your pain goals."   1.Was your pain managed to your expectations on prior hospitalizations?   No prior hospitalizations  2.What is your expectation for pain management during this hospitalization?     Epidural  3.How can we help you reach that goal? epidural  Record the patient's initial score and the patient's pain goal.   Pain: 4  Pain Goal: 6 [facility] wants you to be able to say your pain was always managed very well.  Madison Hickman 11/27/2016

## 2016-11-28 ENCOUNTER — Inpatient Hospital Stay (HOSPITAL_COMMUNITY): Payer: Medicaid Other | Admitting: Anesthesiology

## 2016-11-28 ENCOUNTER — Encounter (HOSPITAL_COMMUNITY)
Admission: RE | Disposition: A | Discharge status: Home / Self Care | Payer: Self-pay | Source: Ambulatory Visit | Attending: Obstetrics and Gynecology

## 2016-11-28 ENCOUNTER — Encounter (HOSPITAL_COMMUNITY): Payer: Self-pay

## 2016-11-28 DIAGNOSIS — O3663X0 Maternal care for excessive fetal growth, third trimester, not applicable or unspecified: Secondary | ICD-10-CM

## 2016-11-28 DIAGNOSIS — O114 Pre-existing hypertension with pre-eclampsia, complicating childbirth: Secondary | ICD-10-CM

## 2016-11-28 DIAGNOSIS — O24429 Gestational diabetes mellitus in childbirth, unspecified control: Secondary | ICD-10-CM

## 2016-11-28 DIAGNOSIS — O99824 Streptococcus B carrier state complicating childbirth: Secondary | ICD-10-CM

## 2016-11-28 DIAGNOSIS — Z3A39 39 weeks gestation of pregnancy: Secondary | ICD-10-CM

## 2016-11-28 LAB — CBC
HCT: 33.7 % — ABNORMAL LOW (ref 36.0–46.0)
HEMATOCRIT: 32 % — AB (ref 36.0–46.0)
HEMOGLOBIN: 10.9 g/dL — AB (ref 12.0–15.0)
Hemoglobin: 11.2 g/dL — ABNORMAL LOW (ref 12.0–15.0)
MCH: 29.4 pg (ref 26.0–34.0)
MCH: 29.9 pg (ref 26.0–34.0)
MCHC: 33.2 g/dL (ref 30.0–36.0)
MCHC: 34.1 g/dL (ref 30.0–36.0)
MCV: 87.7 fL (ref 78.0–100.0)
MCV: 88.5 fL (ref 78.0–100.0)
PLATELETS: 287 10*3/uL (ref 150–400)
PLATELETS: 298 10*3/uL (ref 150–400)
RBC: 3.65 MIL/uL — AB (ref 3.87–5.11)
RBC: 3.81 MIL/uL — AB (ref 3.87–5.11)
RDW: 15.8 % — ABNORMAL HIGH (ref 11.5–15.5)
RDW: 15.7 % — AB (ref 11.5–15.5)
WBC: 11.4 10*3/uL — AB (ref 4.0–10.5)
WBC: 16.6 10*3/uL — AB (ref 4.0–10.5)

## 2016-11-28 LAB — GLUCOSE, CAPILLARY
GLUCOSE-CAPILLARY: 99 mg/dL (ref 65–99)
GLUCOSE-CAPILLARY: 103 mg/dL — AB (ref 65–99)
GLUCOSE-CAPILLARY: 90 mg/dL (ref 65–99)
Glucose-Capillary: 104 mg/dL — ABNORMAL HIGH (ref 65–99)
Glucose-Capillary: 111 mg/dL — ABNORMAL HIGH (ref 65–99)
Glucose-Capillary: 111 mg/dL — ABNORMAL HIGH (ref 65–99)
Glucose-Capillary: 89 mg/dL (ref 65–99)

## 2016-11-28 SURGERY — Surgical Case
Anesthesia: Epidural

## 2016-11-28 MED ORDER — EPHEDRINE 5 MG/ML INJ: 10.0000 mg | INTRAVENOUS | Status: DC | PRN

## 2016-11-28 MED ORDER — CEFAZOLIN SODIUM-DEXTROSE 2-3 GM-% IV SOLR
INTRAVENOUS | Status: DC | PRN
  Administered 2016-11-28: 2 g via INTRAVENOUS

## 2016-11-28 MED ORDER — FENTANYL CITRATE (PF) 100 MCG/2ML IJ SOLN
INTRAMUSCULAR | Status: AC
  Filled 2016-11-28: qty 2

## 2016-11-28 MED ORDER — LIDOCAINE HCL (PF) 1 % IJ SOLN
INTRAMUSCULAR | Status: DC | PRN
  Administered 2016-11-28 (×2): 5 mL via EPIDURAL

## 2016-11-28 MED ORDER — PHENYLEPHRINE 40 MCG/ML (10ML) SYRINGE FOR IV PUSH (FOR BLOOD PRESSURE SUPPORT): 80.0000 ug | PREFILLED_SYRINGE | INTRAVENOUS | Status: DC | PRN

## 2016-11-28 MED ORDER — FENTANYL CITRATE (PF) 100 MCG/2ML IJ SOLN
INTRAMUSCULAR | Status: DC | PRN
  Administered 2016-11-28: 100 ug via EPIDURAL

## 2016-11-28 MED ORDER — CEFAZOLIN SODIUM-DEXTROSE 2-4 GM/100ML-% IV SOLN
INTRAVENOUS | Status: AC
  Filled 2016-11-28: qty 100

## 2016-11-28 MED ORDER — LACTATED RINGERS IV SOLN
INTRAVENOUS | Status: DC | PRN
  Administered 2016-11-28: 40 [IU] via INTRAVENOUS

## 2016-11-28 MED ORDER — OXYTOCIN 10 UNIT/ML IJ SOLN
INTRAMUSCULAR | Status: AC
  Filled 2016-11-28: qty 4

## 2016-11-28 MED ORDER — MORPHINE SULFATE (PF) 0.5 MG/ML IJ SOLN
INTRAMUSCULAR | Status: DC | PRN
  Administered 2016-11-28: 1 mg via INTRAVENOUS
  Administered 2016-11-28: 4 mg via EPIDURAL

## 2016-11-28 MED ORDER — SCOPOLAMINE 1 MG/3DAYS TD PT72
MEDICATED_PATCH | TRANSDERMAL | Status: AC
  Filled 2016-11-28: qty 1

## 2016-11-28 MED ORDER — LACTATED RINGERS IV SOLN: 500.0000 mL | Freq: Once | INTRAVENOUS | Status: DC

## 2016-11-28 MED ORDER — MEPERIDINE HCL 25 MG/ML IJ SOLN
INTRAMUSCULAR | Status: DC | PRN
  Administered 2016-11-28: 12.5 mg via INTRAVENOUS

## 2016-11-28 MED ORDER — PHENYLEPHRINE 40 MCG/ML (10ML) SYRINGE FOR IV PUSH (FOR BLOOD PRESSURE SUPPORT)
80.0000 ug | PREFILLED_SYRINGE | INTRAVENOUS | Status: DC | PRN
  Filled 2016-11-28: qty 10

## 2016-11-28 MED ORDER — SCOPOLAMINE 1 MG/3DAYS TD PT72
MEDICATED_PATCH | TRANSDERMAL | Status: DC | PRN
  Administered 2016-11-28: 1 via TRANSDERMAL

## 2016-11-28 MED ORDER — LACTATED RINGERS IV SOLN
INTRAVENOUS | Status: DC | PRN
  Administered 2016-11-28: 22:00:00 via INTRAVENOUS

## 2016-11-28 MED ORDER — DIPHENHYDRAMINE HCL 50 MG/ML IJ SOLN
12.5000 mg | INTRAMUSCULAR | Status: DC | PRN
  Administered 2016-11-28 (×2): 12.5 mg via INTRAVENOUS

## 2016-11-28 MED ORDER — MORPHINE SULFATE (PF) 0.5 MG/ML IJ SOLN
INTRAMUSCULAR | Status: AC
  Filled 2016-11-28: qty 10

## 2016-11-28 MED ORDER — FENTANYL 2.5 MCG/ML BUPIVACAINE 1/10 % EPIDURAL INFUSION (WH - ANES)
14.0000 mL/h | INTRAMUSCULAR | Status: DC | PRN
  Administered 2016-11-28: 14 mL/h via EPIDURAL
  Filled 2016-11-28: qty 100

## 2016-11-28 MED ORDER — DIPHENHYDRAMINE HCL 50 MG/ML IJ SOLN
12.5000 mg | INTRAMUSCULAR | Status: DC | PRN
  Filled 2016-11-28: qty 1

## 2016-11-28 MED ORDER — LACTATED RINGERS IV SOLN
500.0000 mL | Freq: Once | INTRAVENOUS | Status: AC
  Administered 2016-11-28: 300 mL via INTRAVENOUS

## 2016-11-28 MED ORDER — FENTANYL 2.5 MCG/ML BUPIVACAINE 1/10 % EPIDURAL INFUSION (WH - ANES)
14.0000 mL/h | INTRAMUSCULAR | Status: DC | PRN
  Administered 2016-11-28: 14 mL/h via EPIDURAL

## 2016-11-28 MED ORDER — MEPERIDINE HCL 25 MG/ML IJ SOLN
INTRAMUSCULAR | Status: AC
  Filled 2016-11-28: qty 1

## 2016-11-28 MED ORDER — ONDANSETRON HCL 4 MG/2ML IJ SOLN
INTRAMUSCULAR | Status: DC | PRN
  Administered 2016-11-28: 4 mg via INTRAVENOUS

## 2016-11-28 MED ORDER — SODIUM BICARBONATE 8.4 % IV SOLN
INTRAVENOUS | Status: DC | PRN
  Administered 2016-11-28 (×2): 5 mL via EPIDURAL
  Administered 2016-11-28: 3 mL via EPIDURAL
  Administered 2016-11-28: 2 mL via EPIDURAL
  Administered 2016-11-28: 5 mL via EPIDURAL

## 2016-11-28 MED ORDER — ONDANSETRON HCL 4 MG/2ML IJ SOLN
INTRAMUSCULAR | Status: AC
Start: 2016-11-28 — End: 2016-11-28
  Filled 2016-11-28: qty 2

## 2016-11-28 SURGICAL SUPPLY — 37 items
CHLORAPREP W/TINT 26ML (MISCELLANEOUS) ×4 IMPLANT
CLAMP CORD UMBIL (MISCELLANEOUS) IMPLANT
CLOTH BEACON ORANGE TIMEOUT ST (SAFETY) ×2 IMPLANT
DERMABOND ADVANCED (GAUZE/BANDAGES/DRESSINGS) ×1
DERMABOND ADVANCED .7 DNX12 (GAUZE/BANDAGES/DRESSINGS) ×1 IMPLANT
DRSG OPSITE POSTOP 4X10 (GAUZE/BANDAGES/DRESSINGS) ×2 IMPLANT
ELECT REM PT RETURN 9FT ADLT (ELECTROSURGICAL) ×2
ELECTRODE REM PT RTRN 9FT ADLT (ELECTROSURGICAL) ×1 IMPLANT
EXTRACTOR VACUUM BELL STYLE (SUCTIONS) IMPLANT
GLOVE BIOGEL PI IND STRL 7.0 (GLOVE) ×1 IMPLANT
GLOVE BIOGEL PI IND STRL 8 (GLOVE) ×1 IMPLANT
GLOVE BIOGEL PI INDICATOR 7.0 (GLOVE) ×1
GLOVE BIOGEL PI INDICATOR 8 (GLOVE) ×1
GLOVE ECLIPSE 8.0 STRL XLNG CF (GLOVE) ×2 IMPLANT
GOWN STRL REUS W/TWL LRG LVL3 (GOWN DISPOSABLE) ×4 IMPLANT
KIT ABG SYR 3ML LUER SLIP (SYRINGE) ×2 IMPLANT
NEEDLE HYPO 18GX1.5 BLUNT FILL (NEEDLE) ×2 IMPLANT
NEEDLE HYPO 22GX1.5 SAFETY (NEEDLE) ×2 IMPLANT
NEEDLE HYPO 25X5/8 SAFETYGLIDE (NEEDLE) ×2 IMPLANT
NS IRRIG 1000ML POUR BTL (IV SOLUTION) ×2 IMPLANT
PACK C SECTION WH (CUSTOM PROCEDURE TRAY) ×2 IMPLANT
PAD ABD DERMACEA PRESS 5X9 (GAUZE/BANDAGES/DRESSINGS) ×2 IMPLANT
PAD OB MATERNITY 4.3X12.25 (PERSONAL CARE ITEMS) ×2 IMPLANT
PENCIL SMOKE EVAC W/HOLSTER (ELECTROSURGICAL) ×2 IMPLANT
RTRCTR C-SECT PINK 25CM LRG (MISCELLANEOUS) IMPLANT
SUT CHROMIC 0 CT 1 (SUTURE) ×2 IMPLANT
SUT MNCRL 0 VIOLET CTX 36 (SUTURE) ×2 IMPLANT
SUT MONOCRYL 0 CTX 36 (SUTURE) ×2
SUT PLAIN 2 0 (SUTURE)
SUT PLAIN 2 0 XLH (SUTURE) IMPLANT
SUT PLAIN ABS 2-0 CT1 27XMFL (SUTURE) IMPLANT
SUT VIC AB 0 CTX 36 (SUTURE) ×1
SUT VIC AB 0 CTX36XBRD ANBCTRL (SUTURE) ×1 IMPLANT
SUT VIC AB 4-0 KS 27 (SUTURE) IMPLANT
SYR 20CC LL (SYRINGE) ×4 IMPLANT
TOWEL OR 17X24 6PK STRL BLUE (TOWEL DISPOSABLE) ×2 IMPLANT
TRAY FOLEY BAG SILVER LF 14FR (SET/KITS/TRAYS/PACK) IMPLANT

## 2016-11-28 NOTE — Anesthesia Procedure Notes (Signed)
Epidural Patient location during procedure: OB Start time: 11/28/2016 4:47 PM End time: 11/28/2016 4:58 PM  Staffing Anesthesiologist: Chaney Malling, Alice Vitelli Performed: anesthesiologist   Preanesthetic Checklist Completed: patient identified, site marked, pre-op evaluation, timeout performed, IV checked, risks and benefits discussed and monitors and equipment checked  Epidural Patient position: sitting Prep: DuraPrep Patient monitoring: heart rate, cardiac monitor, continuous pulse ox and blood pressure Approach: midline Location: L2-L3 Injection technique: LOR saline  Needle:  Needle type: Tuohy  Needle gauge: 17 G Needle length: 9 cm Needle insertion depth: 7.5 cm Catheter type: closed end flexible Catheter size: 19 Gauge Catheter at skin depth: 13 cm Test dose: negative and Other  Assessment Events: blood not aspirated, injection not painful, no injection resistance and negative IV test  Additional Notes Informed consent obtained prior to proceeding including risk of failure, 1% risk of PDPH, risk of minor discomfort and bruising.  Discussed rare but serious complications including epidural abscess, permanent nerve injury, epidural hematoma.  Discussed alternatives to epidural analgesia and patient desires to proceed.  Timeout performed pre-procedure verifying patient name, procedure, and platelet count.  Patient tolerated procedure well. Reason for block:procedure for pain

## 2016-11-28 NOTE — Transfer of Care (Signed)
Immediate Anesthesia Transfer of Care Note  Patient: Jill Madden  Procedure(s) Performed: Procedure(s): CESAREAN SECTION (N/A)  Patient Location: PACU  Anesthesia Type:Epidural  Level of Consciousness: awake, alert , oriented and patient cooperative  Airway & Oxygen Therapy: Patient Spontanous Breathing  Post-op Assessment: Report given to RN  Post vital signs: Reviewed and stable  Last Vitals:  Vitals:   11/28/16 2130 11/28/16 2156  BP: (!) 159/90 (!) 167/101  Pulse: 85 82  Resp:  18  Temp:  36.8 C    Last Pain:  Vitals:   11/28/16 2156  TempSrc: Oral  PainSc: 0-No pain         Complications: No apparent anesthesia complications

## 2016-11-28 NOTE — Op Note (Signed)
Preoperative diagnosis:  1.  Intrauterine pregnancy at [redacted]w[redacted]d  weeks gestation                                         2.  Secondary arrest of dilatation and descent of labor                                         3.  CHTN with Superimposed pre eclampsia                                         4.  Class A1 DM   Postoperative diagnosis:  Same as above   Procedure:  Primary cesarean section  Surgeon:  Lazaro Arms MD  Assistant:    Anesthesia: Epidural  Findings:  .    Over a low transverse incision was delivered a viable female at 2224 with Apgars of 10 and 10 weighing pending lbs.  oz. Uterus, tubes and ovaries were all normal.  There were no other significant findings  Description of operation:  Patient was taken to the operating room and placed in the sitting position where she underwent a spinal anesthetic. She was then placed in the supine position with tilt to the left side. When adequate anesthetic level was obtained she was prepped and draped in usual sterile fashion and a Foley catheter was placed. A Pfannenstiel skin incision was made and carried down sharply to the rectus fascia which was scored in the midline extended laterally. The fascia was taken off the muscles both superiorly and without difficulty. The muscles were divided.  The peritoneal cavity was entered.  Bladder blade was placed, no bladder flap was created.  A low transverse hysterotomy incision was made and delivered a viable female  infant at 2224 with Apgars of 10 and 10 weighing lbs  oz.  Cord pH was obtained and was pending. The uterus was exteriorized. It was closed in 2 layers, the first being a running interlocking layer and the second being an imbricating layer using 0 monocryl on a CTX needle. There was good resulting hemostasis. The uterus tubes and ovaries were all normal. Peritoneal cavity was irrigated vigorously. The muscles and peritoneum were reapproximated loosely. The fascia was closed using 0 Vicryl in  running fashion. Subcutaneous tissue was made hemostatic and irrigated. The skin was closed using 4-0 Vicryl on a Keith needle in a subcuticular fashion.  Dermabond was placed for additional wound integrity and to serve as a barrier. Blood loss for the procedure was 500 cc. The patient received a gram of Ancef prophylactically. The patient was taken to the recovery room in good stable condition with all counts being correct x3.  EBL 500 cc  EURE,LUTHER H 11/28/2016 10:49 PM

## 2016-11-28 NOTE — Progress Notes (Signed)
Patient ID: Jill Madden, female   DOB: 1996-08-23, 23 y.o.   MRN: [id number] [redacted]w[redacted]d Estimated Date of Delivery: 12/04/16  Cervix 4/100/-3 asynclitic Discussed with patient regarding the clinical course She agrees to proceed with a primary Caesarean section due to secodnary arrest disorder Has epidural Has been drinking liquids during labor, OK to proceed with section at this time  Lazaro Arms, MD 11/28/2016 9:54 PM

## 2016-11-28 NOTE — Progress Notes (Signed)
Labor Progress Note Jill Madden is a 21 y.o. G2P0010 at [redacted]w[redacted]d presented for IOL due to SIPE on cHTN.  S: No issues.   O:  BP (!) 130/58   Pulse 83   Temp 98.1 F (36.7 C) (Axillary)   Resp 18   Ht 5' 7.5" (1.715 m)   Wt 243 lb (110.2 kg)   LMP 02/28/2016 (Exact Date)   BMI 37.50 kg/m  EFM: 140/mod var/no decels  CVE: Dilation: 4 Effacement (%): 80 Cervical Position: Middle Station: -3 Presentation: Vertex Exam by:: Manning Charity RN   A&P: 21 y.o. G2P0010 [redacted]w[redacted]d admitted for IOL due to SIPE. Also with GDM with LGA >90% at [redacted]w[redacted]d #SIPE: normal to mild range BP tonight. Continue Mg & Labetalol protocol plus PO labetalol 300 mg TID  #GDM: last two CBG's less than 120. Continue CBG q4h.  #Labor: s/p FB and Cytotec. Pitocin  mu/hr. No significant cervical check since last check.  #Pain: PRN fentanyl #FWB: CAT-1 #GBS: positive. Continue PCN  Almon Hercules, MD 12:34 AM

## 2016-11-28 NOTE — Progress Notes (Signed)
Jill Madden is a 21 y.o. G2P0010 at [redacted]w[redacted]d by ultrasound admitted for induction of labor due to Pre-eclamptic toxemia of pregnancy..  Subjective:   Objective: BP (!) 157/98   Pulse 74   Temp 98.2 F (36.8 C) (Oral)   Resp 16   Ht 5' 7.5" (1.715 m)   Wt 243 lb (110.2 kg)   LMP 02/28/2016 (Exact Date)   BMI 37.50 kg/m  I/O last 3 completed shifts: In: 5366.7 [P.O.:2144; I.V.:2972.7; IV Piggyback:250] Out: 7575 [Urine:7575] Total I/O In: 3530.7 [P.O.:2400; I.V.:1030.7; IV Piggyback:100] Out: 3050 [Urine:3050]  FHT:  FHR: 120's bpm, variability: moderate,  accelerations:  Present,  decelerations:  Absent UC:   regular, every 2-3 minutes SVE:   Dilation: 3 Effacement (%): 50, 60 Station: -3 Exam by:: Eliberto Ivory RN  Labs: Lab Results  Component Value Date   WBC 13.4 (H) 11/27/2016   HGB 11.2 (L) 11/27/2016   HCT 33.3 (L) 11/27/2016   MCV 87.9 11/27/2016   PLT 293 11/27/2016    Assessment / Plan: Induction of labor due to preeclampsia,  progressing well on pitocin  Labor: yet to be in labor Preeclampsia:  on magnesium sulfate Fetal Wellbeing:  Category I Pain Control:  Labor support without medications I/D:  n/a Anticipated MOD:  NSVD  Wyvonnia Dusky 11/28/2016, 3:44 PM

## 2016-11-28 NOTE — Progress Notes (Signed)
Jill Madden is a 21 y.o. G2P0010 at [redacted]w[redacted]d by ultrasound admitted for induction of labor due to Pre-eclamptic toxemia of pregnancy..  Subjective:   Objective: BP 138/69   Pulse 84   Temp 98.1 F (36.7 C) (Oral)   Resp 18   Ht 5' 7.5" (1.715 m)   Wt 243 lb (110.2 kg)   LMP 02/28/2016 (Exact Date)   BMI 37.50 kg/m  I/O last 3 completed shifts: In: 5366.7 [P.O.:2144; I.V.:2972.7; IV Piggyback:250] Out: 7575 [Urine:7575] Total I/O In: 1213.6 [P.O.:720; I.V.:443.6; IV Piggyback:50] Out: 1000 [Urine:1000]  FHT:  FHR: 140 bpm, variability: moderate,  accelerations:  Present,  decelerations:  Absent UC:   Yet to be in labor SVE:   Dilation: 3 Effacement (%): 50, 60 Station: -3 Exam by:: Eliberto Ivory RN  Labs: Lab Results  Component Value Date   WBC 13.4 (H) 11/27/2016   HGB 11.2 (L) 11/27/2016   HCT 33.3 (L) 11/27/2016   MCV 87.9 11/27/2016   PLT 293 11/27/2016    Assessment / Plan: Induction of labor due to preeclampsia,  progressing well on pitocin  Labor: yet to be in labor Preeclampsia:  on magnesium sulfate Fetal Wellbeing:  Category I Pain Control:  Labor support without medications I/D:  n/a Anticipated MOD:  NSVD  Wyvonnia Dusky 11/28/2016, 11:02 AM

## 2016-11-28 NOTE — Progress Notes (Signed)
Jill Madden is a 21 y.o. G2P0010 at [redacted]w[redacted]d by ultrasound admitted for induction of labor due to Pre-eclamptic toxemia of pregnancy..  Subjective:   Objective: BP (!) 147/85   Pulse 85   Temp 98.4 F (36.9 C) (Oral)   Resp 18   Ht 5' 7.5" (1.715 m)   Wt 243 lb (110.2 kg)   LMP 02/28/2016 (Exact Date)   BMI 37.50 kg/m  I/O last 3 completed shifts: In: 10117.8 [P.O.:4664; I.V.:4703.8; Other:350; IV Piggyback:400] Out: 82956 [Urine:10625] Total I/O In: 310.6 [I.V.:130.6; Other:180] Out: -   FHT:  FHR: 120 bpm, variability: moderate,  accelerations:  Present,  decelerations:  Absent UC:   regular, every 2 minutes SVE:   Dilation: 5 Effacement (%): 70 Station: -2 Exam by:: s grindstaff rn   Labs: Lab Results  Component Value Date   WBC 11.4 (H) 11/28/2016   HGB 10.9 (L) 11/28/2016   HCT 32.0 (L) 11/28/2016   MCV 87.7 11/28/2016   PLT 287 11/28/2016    Assessment / Plan: Induction of labor due to preeclampsia,  progressing well on pitocin  Labor: Progressing normally Preeclampsia:  on magnesium sulfate Fetal Wellbeing:  Category I Pain Control:  Epidural I/D:  n/a Anticipated MOD:  NSVD  Jill Madden 11/28/2016, 8:02 PM

## 2016-11-28 NOTE — Progress Notes (Addendum)
Patient seen. Doing well. No complaints. She has been having contraction since initiation of pitocin approximately 8PM. She had been having contractions prior to initiation of pitocin. She has no other complaints. She has not been uncomfortable at all nd is barely feeling contractions. She has been up to 16 of pitocin. Will plan for pit break and eating. No other concerns. Restart pit at 8am. Cervix unchagned. Sugars and BP have been wnl  Ernestina Penna, MD 11/28/16 [phone]

## 2016-11-28 NOTE — Anesthesia Preprocedure Evaluation (Signed)
Anesthesia Evaluation  Patient identified by MRN, date of birth, ID band Patient awake    Reviewed: Allergy & Precautions, H&P , NPO status , Patient's Chart, lab work & pertinent test results  Airway Mallampati: II   Neck ROM: full    Dental   Pulmonary former smoker,    breath sounds clear to auscultation       Cardiovascular hypertension,  Rhythm:regular Rate:Normal     Neuro/Psych    GI/Hepatic   Endo/Other  diabetes, Gestational  Renal/GU      Musculoskeletal   Abdominal   Peds  Hematology   Anesthesia Other Findings   Reproductive/Obstetrics                             Anesthesia Physical Anesthesia Plan  ASA: II  Anesthesia Plan: Epidural   Post-op Pain Management:    Induction: Intravenous  Airway Management Planned: Natural Airway  Additional Equipment:   Intra-op Plan:   Post-operative Plan:   Informed Consent: I have reviewed the patients History and Physical, chart, labs and discussed the procedure including the risks, benefits and alternatives for the proposed anesthesia with the patient or authorized representative who has indicated his/her understanding and acceptance.     Plan Discussed with: CRNA, Anesthesiologist and Surgeon  Anesthesia Plan Comments:         Anesthesia Quick Evaluation

## 2016-11-28 NOTE — Transfer of Care (Signed)
Immediate Anesthesia Transfer of Care Note  Patient: Jill Madden  Procedure(s) Performed: Procedure(s): CESAREAN SECTION (N/A)  Patient Location: PACU  Anesthesia Type:Epidural  Level of Consciousness: awake, oriented and patient cooperative  Airway & Oxygen Therapy: Patient Spontanous Breathing  Post-op Assessment: Report given to RN  Post vital signs: Reviewed and stable  Last Vitals:  Vitals:   11/28/16 2130 11/28/16 2156  BP: (!) 159/90 (!) 167/101  Pulse: 85 82  Resp:  18  Temp:  36.8 C    Last Pain:  Vitals:   11/28/16 2156  TempSrc: Oral  PainSc: 0-No pain         Complications: K+ 2.8 MDA informed

## 2016-11-29 ENCOUNTER — Encounter (HOSPITAL_COMMUNITY): Payer: Self-pay

## 2016-11-29 LAB — CBC
HCT: 32.1 % — ABNORMAL LOW (ref 36.0–46.0)
Hemoglobin: 10.7 g/dL — ABNORMAL LOW (ref 12.0–15.0)
MCH: 29.2 pg (ref 26.0–34.0)
MCHC: 33.3 g/dL (ref 30.0–36.0)
MCV: 87.5 fL (ref 78.0–100.0)
PLATELETS: 296 10*3/uL (ref 150–400)
RBC: 3.67 MIL/uL — ABNORMAL LOW (ref 3.87–5.11)
RDW: 15.8 % — AB (ref 11.5–15.5)
WBC: 12.9 10*3/uL — ABNORMAL HIGH (ref 4.0–10.5)

## 2016-11-29 LAB — COMPREHENSIVE METABOLIC PANEL
ALBUMIN: 2.5 g/dL — AB (ref 3.5–5.0)
ALK PHOS: 149 U/L — AB (ref 38–126)
ALT: 13 U/L — ABNORMAL LOW (ref 14–54)
AST: 16 U/L (ref 15–41)
Anion gap: 9 (ref 5–15)
BILIRUBIN TOTAL: 1 mg/dL (ref 0.3–1.2)
CALCIUM: 7.9 mg/dL — AB (ref 8.9–10.3)
CO2: 23 mmol/L (ref 22–32)
CREATININE: 0.62 mg/dL (ref 0.44–1.00)
Chloride: 103 mmol/L (ref 101–111)
GFR calc Af Amer: >60 mL/min (ref >60)
GLUCOSE: 102 mg/dL — AB (ref 65–99)
POTASSIUM: 3 mmol/L — AB (ref 3.5–5.1)
Sodium: 135 mmol/L (ref 135–145)
TOTAL PROTEIN: 5.4 g/dL — AB (ref 6.5–8.1)

## 2016-11-29 LAB — GLUCOSE, CAPILLARY
GLUCOSE-CAPILLARY: 105 mg/dL — AB (ref 65–99)
GLUCOSE-CAPILLARY: 106 mg/dL — AB (ref 65–99)

## 2016-11-29 MED ORDER — OXYCODONE HCL 5 MG PO TABS
5.0000 mg | ORAL_TABLET | Freq: Once | ORAL | Status: AC | PRN
  Administered 2016-11-30: 5 mg via ORAL
  Filled 2016-11-29: qty 1

## 2016-11-29 MED ORDER — KETOROLAC TROMETHAMINE 30 MG/ML IJ SOLN
INTRAMUSCULAR | Status: AC
  Filled 2016-11-29: qty 1

## 2016-11-29 MED ORDER — NALBUPHINE HCL 10 MG/ML IJ SOLN: 5.0000 mg | INTRAMUSCULAR | Status: DC | PRN

## 2016-11-29 MED ORDER — HYDROMORPHONE HCL 1 MG/ML IJ SOLN: 0.2500 mg | INTRAMUSCULAR | Status: DC | PRN

## 2016-11-29 MED ORDER — PRENATAL MULTIVITAMIN CH
1.0000 | ORAL_TABLET | Freq: Every day | ORAL | Status: DC
  Administered 2016-11-29 – 2016-11-30 (×2): 1 via ORAL
  Filled 2016-11-29 (×2): qty 1

## 2016-11-29 MED ORDER — MENTHOL 3 MG MT LOZG: 1.0000 | LOZENGE | OROMUCOSAL | Status: DC | PRN

## 2016-11-29 MED ORDER — NALOXONE HCL 2 MG/2ML IJ SOSY
1.0000 ug/kg/h | PREFILLED_SYRINGE | INTRAVENOUS | Status: DC | PRN
  Filled 2016-11-29: qty 2

## 2016-11-29 MED ORDER — NALBUPHINE HCL 10 MG/ML IJ SOLN: 5.0000 mg | Freq: Once | INTRAMUSCULAR | Status: AC | PRN

## 2016-11-29 MED ORDER — HYDRALAZINE HCL 20 MG/ML IJ SOLN
INTRAMUSCULAR | Status: AC
  Filled 2016-11-29: qty 1

## 2016-11-29 MED ORDER — AMLODIPINE BESYLATE 10 MG PO TABS
10.0000 mg | ORAL_TABLET | Freq: Every day | ORAL | Status: DC
  Administered 2016-11-29 – 2016-12-01 (×3): 10 mg via ORAL
  Filled 2016-11-29 (×3): qty 1

## 2016-11-29 MED ORDER — SCOPOLAMINE 1 MG/3DAYS TD PT72
1.0000 | MEDICATED_PATCH | Freq: Once | TRANSDERMAL | Status: DC
  Filled 2016-11-29: qty 1

## 2016-11-29 MED ORDER — WITCH HAZEL-GLYCERIN EX PADS: 1.0000 "application " | MEDICATED_PAD | CUTANEOUS | Status: DC | PRN

## 2016-11-29 MED ORDER — KETOROLAC TROMETHAMINE 30 MG/ML IJ SOLN: 30.0000 mg | Freq: Four times a day (QID) | INTRAMUSCULAR | Status: AC | PRN

## 2016-11-29 MED ORDER — SIMETHICONE 80 MG PO CHEW: 80.0000 mg | CHEWABLE_TABLET | ORAL | Status: DC | PRN

## 2016-11-29 MED ORDER — NALOXONE HCL 0.4 MG/ML IJ SOLN: 0.4000 mg | INTRAMUSCULAR | Status: DC | PRN

## 2016-11-29 MED ORDER — ONDANSETRON HCL 4 MG/2ML IJ SOLN
INTRAMUSCULAR | Status: AC
  Administered 2016-11-29: 4 mg
  Filled 2016-11-29: qty 2

## 2016-11-29 MED ORDER — MAGNESIUM SULFATE 40 G IN LACTATED RINGERS - SIMPLE
2.0000 g/h | INTRAVENOUS | Status: DC
  Filled 2016-11-29 (×2): qty 500

## 2016-11-29 MED ORDER — OXYTOCIN 40 UNITS IN LACTATED RINGERS INFUSION - SIMPLE MED: 2.5000 [IU]/h | INTRAVENOUS | Status: DC

## 2016-11-29 MED ORDER — TETANUS-DIPHTH-ACELL PERTUSSIS 5-2.5-18.5 LF-MCG/0.5 IM SUSP: 0.5000 mL | Freq: Once | INTRAMUSCULAR | Status: DC

## 2016-11-29 MED ORDER — ACETAMINOPHEN 325 MG PO TABS
650.0000 mg | ORAL_TABLET | ORAL | Status: DC | PRN
  Administered 2016-11-29 – 2016-12-01 (×7): 650 mg via ORAL
  Filled 2016-11-29 (×7): qty 2

## 2016-11-29 MED ORDER — LACTATED RINGERS IV SOLN
INTRAVENOUS | Status: DC
  Administered 2016-11-29: 12:00:00 via INTRAVENOUS

## 2016-11-29 MED ORDER — SODIUM CHLORIDE 0.9% FLUSH: 3.0000 mL | INTRAVENOUS | Status: DC | PRN

## 2016-11-29 MED ORDER — ONDANSETRON HCL 4 MG/2ML IJ SOLN
4.0000 mg | Freq: Four times a day (QID) | INTRAMUSCULAR | Status: AC | PRN
  Administered 2016-11-29: 4 mg via INTRAVENOUS
  Filled 2016-11-29: qty 2

## 2016-11-29 MED ORDER — SIMETHICONE 80 MG PO CHEW
80.0000 mg | CHEWABLE_TABLET | Freq: Three times a day (TID) | ORAL | Status: DC
  Administered 2016-11-29 – 2016-12-01 (×7): 80 mg via ORAL
  Filled 2016-11-29 (×8): qty 1

## 2016-11-29 MED ORDER — HYDRALAZINE HCL 20 MG/ML IJ SOLN
10.0000 mg | INTRAMUSCULAR | Status: DC | PRN
  Administered 2016-11-29: 10 mg via INTRAVENOUS

## 2016-11-29 MED ORDER — DIPHENHYDRAMINE HCL 25 MG PO CAPS: 25.0000 mg | ORAL_CAPSULE | Freq: Four times a day (QID) | ORAL | Status: DC | PRN

## 2016-11-29 MED ORDER — POTASSIUM CHLORIDE CRYS ER 20 MEQ PO TBCR
40.0000 meq | EXTENDED_RELEASE_TABLET | Freq: Two times a day (BID) | ORAL | Status: AC
  Administered 2016-11-29 (×2): 40 meq via ORAL
  Filled 2016-11-29 (×2): qty 2

## 2016-11-29 MED ORDER — COCONUT OIL OIL
1.0000 "application " | TOPICAL_OIL | Status: DC | PRN
  Administered 2016-11-30: 1 via TOPICAL
  Filled 2016-11-29: qty 120

## 2016-11-29 MED ORDER — MEPERIDINE HCL 25 MG/ML IJ SOLN: 6.2500 mg | INTRAMUSCULAR | Status: DC | PRN

## 2016-11-29 MED ORDER — ONDANSETRON HCL 4 MG/2ML IJ SOLN: 4.0000 mg | Freq: Three times a day (TID) | INTRAMUSCULAR | Status: DC | PRN

## 2016-11-29 MED ORDER — IBUPROFEN 600 MG PO TABS
600.0000 mg | ORAL_TABLET | Freq: Four times a day (QID) | ORAL | Status: DC
  Administered 2016-11-29 – 2016-12-01 (×9): 600 mg via ORAL
  Filled 2016-11-29 (×9): qty 1

## 2016-11-29 MED ORDER — HYDRALAZINE HCL 20 MG/ML IJ SOLN
10.0000 mg | INTRAMUSCULAR | Status: DC | PRN
  Administered 2016-11-29 (×3): 10 mg via INTRAVENOUS

## 2016-11-29 MED ORDER — DIBUCAINE 1 % RE OINT: 1.0000 "application " | TOPICAL_OINTMENT | RECTAL | Status: DC | PRN

## 2016-11-29 MED ORDER — DIPHENHYDRAMINE HCL 25 MG PO CAPS: 25.0000 mg | ORAL_CAPSULE | ORAL | Status: DC | PRN

## 2016-11-29 MED ORDER — ZOLPIDEM TARTRATE 5 MG PO TABS: 5.0000 mg | ORAL_TABLET | Freq: Every evening | ORAL | Status: DC | PRN

## 2016-11-29 MED ORDER — POLYSACCHARIDE IRON COMPLEX 150 MG PO CAPS
150.0000 mg | ORAL_CAPSULE | Freq: Two times a day (BID) | ORAL | Status: DC
  Administered 2016-11-29 – 2016-11-30 (×4): 150 mg via ORAL
  Filled 2016-11-29 (×5): qty 1

## 2016-11-29 MED ORDER — DIPHENHYDRAMINE HCL 50 MG/ML IJ SOLN: 12.5000 mg | INTRAMUSCULAR | Status: DC | PRN

## 2016-11-29 MED ORDER — METHYLERGONOVINE MALEATE 0.2 MG PO TABS: 0.2000 mg | ORAL_TABLET | ORAL | Status: DC | PRN

## 2016-11-29 MED ORDER — KETOROLAC TROMETHAMINE 30 MG/ML IJ SOLN
30.0000 mg | Freq: Four times a day (QID) | INTRAMUSCULAR | Status: AC | PRN
  Administered 2016-11-29: 30 mg via INTRAMUSCULAR

## 2016-11-29 MED ORDER — NALBUPHINE HCL 10 MG/ML IJ SOLN
5.0000 mg | Freq: Once | INTRAMUSCULAR | Status: AC | PRN
  Administered 2016-11-29: 5 mg via INTRAVENOUS
  Filled 2016-11-29: qty 1

## 2016-11-29 MED ORDER — METHYLERGONOVINE MALEATE 0.2 MG/ML IJ SOLN: 0.2000 mg | INTRAMUSCULAR | Status: DC | PRN

## 2016-11-29 MED ORDER — OXYCODONE-ACETAMINOPHEN 5-325 MG PO TABS: 1.0000 | ORAL_TABLET | ORAL | Status: DC | PRN

## 2016-11-29 MED ORDER — OXYCODONE-ACETAMINOPHEN 5-325 MG PO TABS: 2.0000 | ORAL_TABLET | ORAL | Status: DC | PRN

## 2016-11-29 MED ORDER — ACETAMINOPHEN 325 MG PO TABS
ORAL_TABLET | ORAL | Status: AC
  Administered 2016-11-29: 650 mg
  Filled 2016-11-29: qty 2

## 2016-11-29 MED ORDER — LABETALOL HCL 200 MG PO TABS: 200.0000 mg | ORAL_TABLET | Freq: Two times a day (BID) | ORAL | Status: DC

## 2016-11-29 MED ORDER — SENNOSIDES-DOCUSATE SODIUM 8.6-50 MG PO TABS
2.0000 | ORAL_TABLET | ORAL | Status: DC
  Administered 2016-11-29 – 2016-11-30 (×3): 2 via ORAL
  Filled 2016-11-29 (×3): qty 2

## 2016-11-29 MED ORDER — SIMETHICONE 80 MG PO CHEW
80.0000 mg | CHEWABLE_TABLET | ORAL | Status: DC
  Administered 2016-11-29: 80 mg via ORAL
  Filled 2016-11-29: qty 1

## 2016-11-29 MED ORDER — LABETALOL HCL 100 MG PO TABS
300.0000 mg | ORAL_TABLET | Freq: Three times a day (TID) | ORAL | Status: DC
  Administered 2016-11-29 (×3): 300 mg via ORAL
  Filled 2016-11-29 (×3): qty 1

## 2016-11-29 MED ORDER — OXYCODONE HCL 5 MG/5ML PO SOLN: 5.0000 mg | Freq: Once | ORAL | Status: AC | PRN

## 2016-11-29 NOTE — Lactation Note (Signed)
This note was copied from a baby's chart. Lactation Consultation Note  Patient Name: Jill Madden [id number] Date: 11/29/2016 Reason for consult: Follow-up assessment;Breast/nipple pain   Follow up with mom of 13 hour old infant. Mom reports infant is feeing well. She reports milk nipple pain throughout feedings, discussed this is not a normal finding. Left LC phone # on board and enc mom to call out for next feeding. Infant currently asleep in FOB arms. Mom asking about nipple care, enc mom to use EBM to nipples after feeding. Report to [place], Charity fundraiser.    Maternal Data Formula Feeding for Exclusion: No Has patient been taught Hand Expression?: Yes Does the patient have breastfeeding experience prior to this delivery?: No  Feeding    LATCH Score/Interventions                      Lactation Tools Discussed/Used     Consult Status Consult Status: Follow-up Date: 11/29/16 Follow-up type: In-patient    Jill Madden 11/29/2016, 11:36 AM

## 2016-11-29 NOTE — Progress Notes (Signed)
Subjective: Postpartum Day #1: Cesarean Delivery, LTCS Patient reports incisional pain and tolerating PO.    Objective: Vital signs in last 24 hours: Temp:  [98.1 F (36.7 C)-100.7 F (38.2 C)] 99.1 F (37.3 C) (04/09 9528) Pulse Rate:  [74-121] 97 (04/09 0637) Resp:  [11-37] 16 (04/09 0637) BP: (130-184)/(61-111) 139/61 (04/09 0637) SpO2:  [95 %-100 %] 97 % (04/09 4132)  Physical Exam:  General: alert, cooperative and no distress Lochia: appropriate Uterine Fundus: firm Incision: no significant drainage, no dehiscence, no significant erythema, pressure dressing on DVT Evaluation: No evidence of DVT seen on physical exam. No cords or calf tenderness. No significant calf/ankle edema. SCDs on.     Recent Labs  11/28/16 2332 11/29/16 0346  HGB 11.2* 10.7*  HCT 33.7* 32.1*    Assessment/Plan: Status post Cesarean section. Postoperative course complicated by Pre-eclampsia.  On Mag Sulfate drip for 24 hours started 0200 on 11/29/16.  Blood pressure range of 153/68-147/70.  TMax: 100.7.  FBG: 90 on 11/29/15.    Continue current care.  Roe Coombs, CNM 11/29/2016, 7:31 AM

## 2016-11-29 NOTE — Plan of Care (Signed)
Problem: Role Relationship: Goal: Ability to demonstrate positive interaction with newborn will improve Outcome: Completed/Met Date Met: 11/29/16 Skin to skin and breast feeding well.  Problem: Bowel/Gastric: Goal: Gastrointestinal status will improve Outcome: Completed/Met Date Met: 11/29/16 Nausea improved no more emesis.

## 2016-11-29 NOTE — Lactation Note (Signed)
This note was copied from a baby's chart. Lactation Consultation Note Saw mom in PACU. Baby cueing some. Mom having elevated b/p.and GDM. Mom states she has been leaking colostrum since she was 5 months pregnant. Mom has everted nipple that needs stimulating to evert shaft longer. Mom had piercing's to nipples. Hand expressed Lt. Breast w/colostrum expressing from side piercing's.  In football position latched baby w/o difficulty. Had good flange w/good compressions and rhythm suckling to breast. Discussed body alignment, positioning, I&O, STS, cluster feeding, supply and demand.  Educated on newborn behavior and feeding habits.  Mom encouraged to feed baby 8-12 times/24 hours and with feeding cues.  FOB supportive and at bedside.  Patient Name: Jill Madden [id number] Date: 11/29/2016 Reason for consult: Initial assessment   Maternal Data Has patient been taught Hand Expression?: Yes Does the patient have breastfeeding experience prior to this delivery?: No  Feeding Feeding Type: Breast Fed Length of feed: 50 min  LATCH Score/Interventions Latch: Grasps breast easily, tongue down, lips flanged, rhythmical sucking.  Audible Swallowing: None Intervention(s): Skin to skin;Hand expression  Type of Nipple: Everted at rest and after stimulation  Comfort (Breast/Nipple): Soft / non-tender     Hold (Positioning): Full assist, staff holds infant at breast Intervention(s): Breastfeeding basics reviewed;Support Pillows;Position options;Skin to skin  LATCH Score: 6  Lactation Tools Discussed/Used     Consult Status Consult Status: Follow-up Date: 11/29/16 Follow-up type: In-patient    Kynan Peasley, Diamond Nickel 11/29/2016, 1:25 AM

## 2016-11-29 NOTE — Anesthesia Postprocedure Evaluation (Addendum)
Anesthesia Post Note  Patient: Jill Madden  Procedure(s) Performed: Procedure(s) (LRB): CESAREAN SECTION (N/A)  Patient location during evaluation: [facility] Anesthesia Type: Epidural Level of consciousness: awake and alert and oriented Pain management: pain level controlled Vital Signs Assessment: post-procedure vital signs reviewed and stable Respiratory status: spontaneous breathing, nonlabored ventilation and patient connected to nasal cannula oxygen Cardiovascular status: stable Postop Assessment: no headache, no backache, epidural receding, no signs of nausea or vomiting, patient able to bend at knees and adequate PO intake Anesthetic complications: no        Last Vitals:  Vitals:   11/29/16 0403 11/29/16 0637  BP: (!) 147/70 139/61  Pulse: (!) 108 97  Resp: 17 16  Temp: 37.7 C 37.3 C    Last Pain:  Vitals:   11/29/16 0637  TempSrc: Oral  PainSc:    Pain Goal:                 Laban Emperor

## 2016-11-29 NOTE — Lactation Note (Signed)
This note was copied from a baby's chart. Lactation Consultation Note  Patient Name: Jill Madden [id number] Date: 11/29/2016 Reason for consult: Follow-up assessment   Follow up with mom of 16 hour old infant at mom's request. Infant recently had a bath and was STS with mom. Infant quietly alert. Assisted mom in latching infant to left breast in the cross cradle hold. Infant latched immediately and fed on and off for 20 minutes. Discussed with mom the difference in nutritive and non nutritive suckling. Infant was noted to have a few swallows. Enc mom to massage/compress breast with feeding. Mom denied pain/pinching with feeding. Once infant detached, nipple is noted to be round. Mom reports the nipple is usually compressed after feeding. Discussed positioning, flanged lips, and good support throughout feeding.   Mom with soft compressible breasts and everted nipples. Areola is semi compressible. Mom is on MgSO4 and is noted to have extremity swelling also. Colostrum was easily expressible from right breast, none from left breast. Enc mom to hand express at every opportunity and to offer infant breast STS 8-12 x in 24 hours at first feeding cues.  Enc mom to call out for feeding assistance as needed. Report to [place], Charity fundraiser.    Maternal Data Formula Feeding for Exclusion: No Has patient been taught Hand Expression?: Yes Does the patient have breastfeeding experience prior to this delivery?: No  Feeding Feeding Type: Breast Fed Length of feed: 15 min  LATCH Score/Interventions Latch: Repeated attempts needed to sustain latch, nipple held in mouth throughout feeding, stimulation needed to elicit sucking reflex. Intervention(s): Skin to skin;Teach feeding cues;Waking techniques Intervention(s): Adjust position;Assist with latch;Breast massage;Breast compression  Audible Swallowing: A few with stimulation Intervention(s): Skin to skin;Hand expression;Alternate breast massage  Type of Nipple:  Everted at rest and after stimulation  Comfort (Breast/Nipple): Soft / non-tender     Hold (Positioning): Assistance needed to correctly position infant at breast and maintain latch. Intervention(s): Breastfeeding basics reviewed;Support Pillows;Position options;Skin to skin  LATCH Score: 7  Lactation Tools Discussed/Used     Consult Status Consult Status: Follow-up Date: 11/30/16 Follow-up type: In-patient    Silas Flood Junnie Loschiavo 11/29/2016, 2:24 PM

## 2016-11-30 LAB — BIRTH TISSUE RECOVERY COLLECTION (PLACENTA DONATION)

## 2016-11-30 NOTE — Lactation Note (Signed)
This note was copied from a baby's chart. Lactation Consultation Note  Patient Name: Jill Madden [id number] Date: 11/30/2016 Reason for consult: Follow-up assessment   With this mom and term baby, now 85 hours old. The mom had baby in shallow latch, semi football hold, baby wrapped in 2 blankets, and nose not touching. Mom's nipple pinched after her latch. I repositioned mom and baby, so baby could wrap around mom's side, and baby's nose was at mom's nipple. Mom was feeding baby, with her left hand, and was on her phone with her right hand. The importance of holding her breast reinforced. The baby needed his bottom lip pulled out, and he was able to latch deeply, all of mom's small areola in his mouth, and good breast movement note. Mom could feel the difference in the latches. I advised her to call for asisstacne with latch, if she felt pinching, or it was not as comfortable as this deep latch was. I also showed dad how to help flange baby's bottom lip.    Maternal Data    Feeding Feeding Type: Breast Fed Length of feed:  (bab was latached when I walked in the rom, still feedng when I left)  LATCH Score/Interventions Latch: Grasps breast easily, tongue down, lips flanged, rhythmical sucking. (bottom lip flanged more after latch by LC, dad shown how to do this) Intervention(s): Adjust position;Assist with latch;Breast compression  Audible Swallowing: A few with stimulation (mom had baby nipple sucking, not holding her breast, nipple pinched, when I walked in. I reivewdd with mom how to obtain a deep latch, ) Intervention(s): Hand expression (m9om had baby in t shirt and 2 blankets, baby not able to latch deeply - I showed mom how to position her self and baby for deep latach)  Type of Nipple: Everted at rest and after stimulation  Comfort (Breast/Nipple): Soft / non-tender     Hold (Positioning): Assistance needed to correctly position infant at breast and maintain  latch. Intervention(s): Breastfeeding basics reviewed;Support Pillows;Position options  LATCH Score: 8  Lactation Tools Discussed/Used     Consult Status Consult Status: Follow-up Date: 12/01/16 Follow-up type: In-patient    Alfred Levins 11/30/2016, 10:37 AM

## 2016-11-30 NOTE — Progress Notes (Signed)
Instructed patient to place baby in crib due to her sleeping.

## 2016-11-30 NOTE — Progress Notes (Signed)
Instructed patient several times not to fall asleep with baby in arms.  Patient verbalized understanding.  Patient asleep, baby placed in crib on back, patient did not wake up at this time.

## 2016-11-30 NOTE — Progress Notes (Signed)
Subjective: Postpartum Day #2: Cesarean Delivery Patient reports incisional pain, tolerating PO, + flatus and no problems voiding.    Objective: Vital signs in last 24 hours: Temp:  [98.2 F (36.8 C)-99.3 F (37.4 C)] 98.2 F (36.8 C) (04/10 0430) Pulse Rate:  [78-92] 88 (04/10 0430) Resp:  [16-20] 16 (04/10 0430) BP: (132-144)/(58-86) 144/86 (04/10 0430) SpO2:  [96 %-100 %] 100 % (04/10 0430)  Physical Exam:  General: alert, cooperative and no distress Lochia: appropriate Uterine Fundus: firm Incision: no significant drainage, no dehiscence, no significant erythema, pressure dressing on DVT Evaluation: No evidence of DVT seen on physical exam. No cords or calf tenderness. No significant calf/ankle edema.   Recent Labs  11/28/16 2332 11/29/16 0346  HGB 11.2* 10.7*  HCT 33.7* 32.1*    Assessment/Plan: Status post Cesarean section. Doing well postoperatively.  Continue current care.  Plan discharge home 12/02/46.  Roe Coombs, CNM 11/30/2016, 7:51 AM

## 2016-11-30 NOTE — H&P (Signed)
LABOR AND DELIVERY ADMISSION HISTORY AND PHYSICAL NOTE  Jill Madden is a 21 y.o. female G2P1011 with IUP at [redacted]w[redacted]d by LMP consistent with anatomy scan presenting for IOL for GDMA2 and cHTN. She does report that she was not compliant with her diabetic diet today and ate pancakes and syrup.    She reports positive fetal movement. She denies leakage of fluid or vaginal bleeding.  Prenatal History/Complications:  Past Medical History: Past Medical History:  Diagnosis Date  . Diabetes mellitus without complication (HCC)   . Gestational diabetes    glyburide  . Hypertension    adolescent  . Pyelonephritis     Past Surgical History: Past Surgical History:  Procedure Laterality Date  . CESAREAN SECTION N/A 11/28/2016   Procedure: CESAREAN SECTION;  Surgeon: Lazaro Arms, MD;  Location: [facility];  Service: Obstetrics;  Laterality: N/A;  . NO PAST SURGERIES      Obstetrical History: OB History    Gravida Para Term Preterm AB Living   SAB TAB Ectopic Multiple Live Births     1   0 1      Social History: Social History   Social History  . Marital status: Single    Spouse name: N/A  . Number of children: N/A  . Years of education: N/A   Social History Main Topics  . Smoking status: Former Smoker    Packs/day: 0.50    Types: Cigars  . Smokeless tobacco: Never Used  . Alcohol use No  . Drug use: No  . Sexual activity: Yes   Other Topics Concern  . None   Social History Narrative  . None    Family History: Family History  Problem Relation Age of Onset  . Diabetes Sister   . Diabetes Maternal Grandmother   . Stroke Maternal Grandmother   . Stroke Mother     Allergies: No Known Allergies  Prescriptions Prior to Admission  Medication Sig Dispense Refill Last Dose  . aspirin EC 81 MG tablet Take 1 tablet (81 mg total) by mouth daily. 30 tablet 2 11/26/2016 at 0800  . labetalol (NORMODYNE) 100 MG tablet Take 2 tablets (200 mg total) by  mouth 2 (two) times daily. 60 tablet 3 11/27/2016 at 0900  . Prenatal Vit-Fe Fumarate-FA (PRENATAL MULTIVITAMIN) TABS tablet Take 1 tablet by mouth daily.    11/26/2016 at Unknown time     Review of Systems   All systems reviewed and negative except as stated in HPI  Blood pressure (!) 156/84, pulse 86, temperature 98.1 F (36.7 C), temperature source Oral, resp. rate 18, height 5' 7.5" (1.715 m), weight 243 lb (110.2 kg), last menstrual period 02/28/2016, SpO2 99 %, unknown if currently breastfeeding. General appearance: alert, cooperative and appears stated age Lungs: clear to auscultation bilaterally Heart: regular rate and rhythm Abdomen: soft, non-tender; bowel sounds normal Extremities: No calf swelling or tenderness Presentation: cephalic by nursing exam Fetal monitoring: category 1  Uterine activity: none Dilation: 4 Effacement (%): 90 Station: -3 Exam by:: Eure MD   Prenatal labs: ABO, Rh: --/--/AB POS, AB POS (04/07 0746) Antibody: NEG (04/07 0746) Rubella: immune RPR: Non Reactive (04/07 0746)  HBsAg: Negative (10/18 1701)  HIV: Non Reactive (01/24 1110)  GBS: Positive (03/19 0956)  2 hr Glucola: abnormal Genetic screening:  Normal T21 Anatomy US: normal  Prenatal Transfer Tool  Maternal Diabetes: Yes:  Diabetes Type:  Insulin/Medication controlled Genetic Screening: Normal Maternal Ultrasounds/Referrals:  Normal Fetal Ultrasounds or other Referrals:  None Maternal Substance Abuse:  No Significant Maternal Medications:  None Significant Maternal Lab Results: Lab values include: Group B Strep positive  Results for orders placed or performed during the hospital encounter of 11/27/16 (from the past 24 hour(s))  Collect bld for placenta donatation   Collection Time: 11/30/16  5:42 AM  Result Value Ref Range   Placenta donation bld collect COLLECTED BY LABORATORY     Patient Active Problem List   Diagnosis Date Noted  . Chronic hypertension affecting pregnancy  11/27/2016  . LGA (large for gestational age) fetus affecting management of mother 11/18/2016  . GBS (group B Streptococcus carrier), +RV culture, currently pregnant 11/10/2016  . GDM (gestational diabetes mellitus) 09/17/2016  . Chronic hypertension during pregnancy 09/15/2016  . Supervision of high risk pregnancy, antepartum 06/09/2016    Assessment: Jill Madden is a 21 y.o. G2P1011 at [redacted]w[redacted]d here for  induction of labor for GDMA2, cHTN  #Labor: induction with cytotec and then foley #Pain: epdirual when able #FWB: Category 1 #ID:  GBS positive - PCN #MOF: breast #MOC: undecided #GDMA2 - elevated sugar on admission, but noncompliant with diet today will monitor sugar q4 #cHTN - elevated this AM but did not take her medicine. Will restart home medication at this time and Check PIH labs.   Ernestina Penna 11/30/2016, 2:39 PM

## 2016-11-30 NOTE — Lactation Note (Signed)
This note was copied from a baby's chart. Lactation Consultation Note  Patient Name: Boy Corine Solorio [id number] Date: 11/30/2016 Reason for consult: Follow-up assessment   With this mom and term baby, now 35 hours old. Dad called me to come and check on mom. She has a small drops of blood from her right nipple, after the baby fed. Mom said her nipple was pinched. I did hand expression, and her colostrum we clear, and her nipple appeard normal, . I told mom that if the baby is latched shallow, it can cause bleeding/ tenderness. I also said it may have been from her previous nipple piercing's. At this time, no bleeding noted, either on her nippleor with expressed milk. Mom told to call for help with latching, as needed.    Maternal Data    Feeding Length of feed:  (bab was latached when I walked in the rom, still feedng when I left)  LATCH Score/Interventions Latch: Grasps breast easily, tongue down, lips flanged, rhythmical sucking. (bottom lip flanged more after latch by LC, dad shown how to do this) Intervention(s): Adjust position;Assist with latch;Breast compression  Audible Swallowing: A few with stimulation (mom had baby nipple sucking, not holding her breast, nipple pinched, when I walked in. I reivewdd with mom how to obtain a deep latch, ) Intervention(s): Hand expression (m9om had baby in t shirt and 2 blankets, baby not able to latch deeply - I showed mom how to position her self and baby for deep latach)  Type of Nipple: Everted at rest and after stimulation  Comfort (Breast/Nipple): Soft / non-tender     Hold (Positioning): Assistance needed to correctly position infant at breast and maintain latch. Intervention(s): Breastfeeding basics reviewed;Support Pillows;Position options  LATCH Score: 8  Lactation Tools Discussed/Used     Consult Status Consult Status: Follow-up Date: 12/01/16 Follow-up type: In-patient    Alfred Levins 11/30/2016, 11:44 AM

## 2016-11-30 NOTE — Progress Notes (Signed)
POD#2 Subjective:  Jamya Starry is a 21 y.o. G2P1011 [redacted]w[redacted]d s/p pLTCS.  No acute events overnight.  Pt denies problems with ambulating, voiding or po intake.  She denies nausea or vomiting.  Pain is well controlled.  She has had flatus.  Lochia Minimal.  Plan for birth control is IUD mirena.  Method of Feeding: breast  Objective: Blood pressure (!) 144/86, pulse 88, temperature 98.2 F (36.8 C), temperature source Oral, resp. rate 16, height 5' 7.5" (1.715 m), weight 243 lb (110.2 kg), last menstrual period 02/28/2016, SpO2 100 %, unknown if currently breastfeeding.  Physical Exam:  General: alert, cooperative and no distress Lochia:normal flow Chest: normal WOB Heart: Regular rate Abdomen: +BS, soft, mild TTP (appropriate) Incision: dressing in place with no sign of bleeding or drainage. Uterine Fundus: firm DVT Evaluation: No evidence of DVT seen on physical exam. Extremities: trace edema   Recent Labs  11/28/16 2332 11/29/16 0346  HGB 11.2* 10.7*  HCT 33.7* 32.1*    Assessment/Plan:  ASSESSMENT: Riann Oman is a 21 y.o. G2P1011 [redacted]w[redacted]d s/p LTCS.   SIPE: s/p Mg. BP in mild range. -Continue Norvac -Labetalol as needed  Plan for discharge tomorrow Continue routine PP care Breastfeeding support PRN  LOS: 3 days   Taye T Gonfa 11/30/2016, 6:45 AM

## 2016-12-01 DIAGNOSIS — Z98891 History of uterine scar from previous surgery: Secondary | ICD-10-CM

## 2016-12-01 MED ORDER — HYDROCHLOROTHIAZIDE 25 MG PO TABS: 25.0000 mg | ORAL_TABLET | Freq: Every day | ORAL | 2 refills | Status: DC

## 2016-12-01 MED ORDER — CITRANATAL BLOOM 90-1 MG PO TABS: 1.0000 | ORAL_TABLET | Freq: Every day | ORAL | 12 refills | Status: DC

## 2016-12-01 MED ORDER — AMLODIPINE BESYLATE 10 MG PO TABS: 10.0000 mg | ORAL_TABLET | Freq: Every day | ORAL | 1 refills | Status: DC

## 2016-12-01 MED ORDER — IBUPROFEN 600 MG PO TABS: 600.0000 mg | ORAL_TABLET | Freq: Four times a day (QID) | ORAL | 0 refills | Status: DC

## 2016-12-01 MED ORDER — FUROSEMIDE 40 MG PO TABS: 40.0000 mg | ORAL_TABLET | Freq: Every day | ORAL | Status: DC

## 2016-12-01 MED ORDER — HYDROCHLOROTHIAZIDE 25 MG PO TABS
25.0000 mg | ORAL_TABLET | Freq: Every day | ORAL | Status: DC
  Administered 2016-12-01: 25 mg via ORAL
  Filled 2016-12-01: qty 1

## 2016-12-01 MED ORDER — OXYCODONE-ACETAMINOPHEN 5-325 MG PO TABS: 1.0000 | ORAL_TABLET | Freq: Four times a day (QID) | ORAL | 0 refills | Status: DC | PRN

## 2016-12-01 NOTE — Discharge Instructions (Signed)

## 2016-12-01 NOTE — Discharge Summary (Signed)
OB Discharge Summary     Patient Name: Jill Madden DOB: [birth year] MRN: [id number]  Date of admission: 11/27/2016 Delivering MD: Duane Lope H   Date of discharge: 12/01/2016  Admitting diagnosis: 39wks induction  Intrauterine pregnancy: [redacted]w[redacted]d     Secondary diagnosis:  Active Problems:   GDM (gestational diabetes mellitus)   Chronic hypertension affecting pregnancy   Status post cesarean section  Additional problems: none     Discharge diagnosis: Term Pregnancy Delivered                                                                                                Post partum procedures:none  Augmentation: Cytotec and Foley Balloon  Complications: None  Hospital course:  Induction of Labor With Cesarean Section  21 y.o. yo G2P1011 at [redacted]w[redacted]d was admitted to the hospital 11/27/2016 for induction of labor. Patient had a labor course significant for secondary arrest. The patient went for cesarean section due to Arrest of Dilation and Arrest of Descent, and delivered a Viable infant,@BABYSUPPRESS (DBLINK,ept,110,,1,,) Membrane Rupture Time/Date: )4:00 PM ,11/28/2016    of operation can be found in separate operative Note.  Patient had an uncomplicated postpartum course. She is ambulating, tolerating a regular diet, passing flatus, and urinating well.  Patient is discharged home in stable condition on 12/01/16.                                    Physical exam  Vitals:   11/30/16 1600 11/30/16 1952 11/30/16 2306 12/01/16 0333  BP: (!) 141/74 (!) 146/82 (!) 148/68 (!) 144/78  Pulse: 82 89 77 84  Resp: Temp: 98.8 F (37.1 C) 98.3 F (36.8 C) 98.1 F (36.7 C)   TempSrc: Oral Oral Oral   SpO2: 99% 99% 100%   Weight:      Height:       General: alert, cooperative and no distress Lochia: appropriate Uterine Fundus: firm Incision: Healing well with no significant drainage, No significant erythema, Dressing is clean, dry, and intact DVT Evaluation: No evidence of  DVT seen on physical exam. Negative Homan's sign. Labs: Lab Results  Component Value Date   WBC 12.9 (H) 11/29/2016   HGB 10.7 (L) 11/29/2016   HCT 32.1 (L) 11/29/2016   MCV 87.5 11/29/2016   PLT 296 11/29/2016   CMP Latest Ref Rng & Units 11/29/2016  Glucose 65 - 99 mg/dL 119(J)  BUN 6 - 20 mg/dL <4(N)  Creatinine 8.29 - 1.00 mg/dL 5.62  Sodium 130 - 865 mmol/L 135  Potassium 3.5 - 5.1 mmol/L 3.0(L)  Chloride 101 - 111 mmol/L 103  CO2 22 - 32 mmol/L 23  Calcium 8.9 - 10.3 mg/dL 7.9(L)  Total Protein 6.5 - 8.1 g/dL 7.8(I)  Total Bilirubin 0.3 - 1.2 mg/dL 1.0  Alkaline Phos 38 - 126 U/L 149(H)  AST 15 - 41 U/L 16  ALT 14 - 54 U/L 13(L)    Discharge instruction: per After Visit Summary and "Baby and Me Booklet".  After visit  meds:  Allergies as of 12/01/2016   No Known Allergies     Medication List    STOP taking these medications   aspirin EC 81 MG tablet   labetalol 100 MG tablet Commonly known as:  NORMODYNE     TAKE these medications   amLODipine 10 MG tablet Commonly known as:  NORVASC Take 1 tablet (10 mg total) by mouth daily.   hydrochlorothiazide 25 MG tablet Commonly known as:  HYDRODIURIL Take 1 tablet (25 mg total) by mouth daily.   ibuprofen 600 MG tablet Commonly known as:  ADVIL,MOTRIN Take 1 tablet (600 mg total) by mouth every 6 (six) hours.   oxyCODONE-acetaminophen 5-325 MG tablet Commonly known as:  PERCOCET/ROXICET Take 1-2 tablets by mouth every 6 (six) hours as needed.   prenatal multivitamin Tabs tablet Take 1 tablet by mouth daily.       Diet: routine diet  Activity: Advance as tolerated. Pelvic rest for 6 weeks.   Outpatient follow up:6 weeks Follow up Appt:No future appointments. Follow up Visit:No Follow-up on file.  Postpartum contraception: IUD Mirena  Newborn Data: Live born female  Birth Weight: 7 lb 9.9 oz (3455 g) APGAR: 10, 10  Baby Feeding: Breast Disposition:home with mother   12/01/2016 [name], DO

## 2016-12-01 NOTE — Discharge Planning (Signed)
Pt given discharge education on post partum care and complications.  Pt verbalizes understanding with no questions.  Follow up appointments and medication discussed, pt verbalizes understanding.  Pt has no questions at this time.  Infant tag removed by nursery and pt DC by wheelchair.    Kiefer Opheim RN

## 2016-12-01 NOTE — Discharge Summary (Signed)
OB Discharge Summary     Patient Name: Jill Madden DOB: 04-17-1996 MRN: [id number]  Date of admission: 11/27/2016 Delivering MD: Duane Lope H   Date of discharge: 12/01/2016  Admitting diagnosis: 39wks induction  Intrauterine pregnancy: [redacted]w[redacted]d     Secondary diagnosis:  Active Problems:   GDM (gestational diabetes mellitus)   Chronic hypertension affecting pregnancy   Status post cesarean section  Additional problems: none     Discharge diagnosis: Term Pregnancy Delivered and s/p PLTCS, CHTN, GDM-A1                                                                                                Post partum procedures:none  Augmentation: Pitocin, Cytotec and Foley Balloon  Complications: None  Hospital course:  Induction of Labor With Cesarean Section  21 y.o. yo G2P1011 at [redacted]w[redacted]d was admitted to the hospital 11/27/2016 for induction of labor. Patient had a labor course significant for CHTN, GDM-A1. The patient went for cesarean section due to Malpresentation, Arrest of Dilation and Arrest of Descent, and delivered a Viable infant,@BABYSUPPRESS (DBLINK,ept,110,,1,,) Membrane Rupture Time/Date: )4:00 PM ,11/28/2016    of operation can be found in separate operative Note.  Patient had an uncomplicated postpartum course. She is ambulating, tolerating a regular diet, passing flatus, and urinating well.  Patient is discharged home in stable condition on 12/01/16.                                    Physical exam  Vitals:   11/30/16 1600 11/30/16 1952 11/30/16 2306 12/01/16 0333  BP: (!) 141/74 (!) 146/82 (!) 148/68 (!) 144/78  Pulse: 82 89 77 84  Resp: Temp: 98.8 F (37.1 C) 98.3 F (36.8 C) 98.1 F (36.7 C)   TempSrc: Oral Oral Oral   SpO2: 99% 99% 100%   Weight:      Height:       General: alert, cooperative and no distress Lochia: appropriate Uterine Fundus: firm Incision: Healing well with no significant drainage, Dressing is clean, dry, and intact DVT  Evaluation: No evidence of DVT seen on physical exam. No cords or calf tenderness. No significant calf/ankle edema. Labs: Lab Results  Component Value Date   WBC 12.9 (H) 11/29/2016   HGB 10.7 (L) 11/29/2016   HCT 32.1 (L) 11/29/2016   MCV 87.5 11/29/2016   PLT 296 11/29/2016   CMP Latest Ref Rng & Units 11/29/2016  Glucose 65 - 99 mg/dL 562(Z)  BUN 6 - 20 mg/dL <3(Y)  Creatinine 8.65 - 1.00 mg/dL 7.84  Sodium 696 - 295 mmol/L 135  Potassium 3.5 - 5.1 mmol/L 3.0(L)  Chloride 101 - 111 mmol/L 103  CO2 22 - 32 mmol/L 23  Calcium 8.9 - 10.3 mg/dL 7.9(L)  Total Protein 6.5 - 8.1 g/dL 2.8(U)  Total Bilirubin 0.3 - 1.2 mg/dL 1.0  Alkaline Phos 38 - 126 U/L 149(H)  AST 15 - 41 U/L 16  ALT 14 - 54 U/L 13(L)    Discharge instruction: per After  Visit Summary and "Baby and Me Booklet".  After visit meds:  Allergies as of 12/01/2016   No Known Allergies     Medication List    STOP taking these medications   aspirin EC 81 MG tablet   labetalol 100 MG tablet Commonly known as:  NORMODYNE     TAKE these medications   amLODipine 10 MG tablet Commonly known as:  NORVASC Take 1 tablet (10 mg total) by mouth daily.   CITRANATAL BLOOM 90-1 MG Tabs Take 1 tablet by mouth daily.   hydrochlorothiazide 25 MG tablet Commonly known as:  HYDRODIURIL Take 1 tablet (25 mg total) by mouth daily.   ibuprofen 600 MG tablet Commonly known as:  ADVIL,MOTRIN Take 1 tablet (600 mg total) by mouth every 6 (six) hours.   oxyCODONE-acetaminophen 5-325 MG tablet Commonly known as:  PERCOCET/ROXICET Take 1-2 tablets by mouth every 6 (six) hours as needed.   prenatal multivitamin Tabs tablet Take 1 tablet by mouth daily.       Diet: routine diet  Activity: Advance as tolerated. Pelvic rest for 6 weeks.   Outpatient follow up:2 weeks Follow up Appt:No future appointments. Follow up Visit:No Follow-up on file.  Postpartum contraception: IUD Mirena  Newborn Data: Live born female   Birth Weight: 7 lb 9.9 oz (3455 g) APGAR: 10, 10  Baby Feeding: Breast Disposition:home with mother   12/01/2016 Roe Coombs, CNM

## 2016-12-01 NOTE — Progress Notes (Signed)
Patient instructed several times to not sleep with baby in bed with her.  Patient verbalized understanding, baby safety sheet reviewed. Significant other at bedside, asleep also.

## 2016-12-01 NOTE — Progress Notes (Signed)
Subjective: Postpartum Day #3: Cesarean Delivery Patient reports incisional pain, tolerating PO, + flatus and no problems voiding.    Objective: Vital signs in last 24 hours: Temp:  [98.1 F (36.7 C)-98.8 F (37.1 C)] 98.1 F (36.7 C) (04/10 2306) Pulse Rate:  [71-89] 84 (04/11 0333) Resp:  [16-20] 20 (04/11 0333) BP: (141-156)/(68-84) 144/78 (04/11 0333) SpO2:  [98 %-100 %] 100 % (04/10 2306)  Physical Exam:  General: alert, cooperative and no distress Lochia: appropriate Uterine Fundus: firm Incision: no significant drainage, no dehiscence, no significant erythema DVT Evaluation: No evidence of DVT seen on physical exam. No cords or calf tenderness. No significant calf/ankle edema.   Recent Labs  11/28/16 2332 11/29/16 0346  HGB 11.2* 10.7*  HCT 33.7* 32.1*    Assessment/Plan: Status post Cesarean section. Doing well postoperatively.  Discharge home with standard precautions and return to clinic in 2 weeks.  Roe Coombs, CNM 12/01/2016, 7:45 AM

## 2016-12-01 NOTE — Progress Notes (Signed)
Baby love contacted by phone and message left to notify of post partum BP check visit before 4/18.  Oletta Lamas RN

## 2016-12-07 ENCOUNTER — Encounter: Payer: Self-pay | Admitting: Certified Nurse Midwife

## 2016-12-07 ENCOUNTER — Encounter: Payer: Self-pay | Admitting: *Deleted

## 2016-12-07 ENCOUNTER — Other Ambulatory Visit: Payer: Self-pay | Admitting: Certified Nurse Midwife

## 2016-12-08 ENCOUNTER — Other Ambulatory Visit: Payer: Self-pay | Admitting: Certified Nurse Midwife

## 2016-12-08 ENCOUNTER — Telehealth: Payer: Self-pay | Admitting: *Deleted

## 2016-12-08 NOTE — Telephone Encounter (Signed)
Reviewed BP reading with provider Boykin Reaper, CNM. Reading is okay. Pt to continue BP meds as directed. LVM for pt to c/b

## 2016-12-08 NOTE — Telephone Encounter (Signed)
Nurse with [employer] [facility] called to report a bp taken on 4/16, late afternoon. Patient bp was 140/78. Small amount of edema bu no other symptoms. Patient is taking her bp medication regularly.

## 2016-12-08 NOTE — Telephone Encounter (Signed)
Pt aware and agrees to cont. meds as directed. Next home nurse visit scheduled for Monday per pt.

## 2016-12-13 ENCOUNTER — Encounter: Payer: Self-pay | Admitting: Obstetrics and Gynecology

## 2016-12-13 ENCOUNTER — Ambulatory Visit (INDEPENDENT_AMBULATORY_CARE_PROVIDER_SITE_OTHER): Payer: Medicaid Other | Admitting: Obstetrics and Gynecology

## 2016-12-13 VITALS — BP 130/90 | HR 87 | Wt 209.2 lb

## 2016-12-13 DIAGNOSIS — Z3483 Encounter for supervision of other normal pregnancy, third trimester: Secondary | ICD-10-CM

## 2016-12-13 DIAGNOSIS — Z09 Encounter for follow-up examination after completed treatment for conditions other than malignant neoplasm: Secondary | ICD-10-CM

## 2016-12-13 NOTE — Progress Notes (Signed)
20 yo G2P1011 here for postop check following c-section on 4/11. Patient reports feeling well. She denies fever, chills, or abnormal drainage from her incision. She is no longer taking pain medication for her incisional pain. She remains abstinent  Past Medical History:  Diagnosis Date  . Diabetes mellitus without complication (HCC)   . Gestational diabetes    glyburide  . Hypertension    adolescent  . Pyelonephritis    Past Surgical History:  Procedure Laterality Date  . CESAREAN SECTION N/A 11/28/2016   Procedure: CESAREAN SECTION;  Surgeon: Lazaro Arms, MD;  Location: [facility] BIRTHING SUITES;  Service: Obstetrics;  Laterality: N/A;  . NO PAST SURGERIES     Family History  Problem Relation Age of Onset  . Diabetes Sister   . Diabetes Maternal Grandmother   . Stroke Maternal Grandmother   . Stroke Mother    Social History  Substance Use Topics  . Smoking status: Former Smoker    Packs/day: 0.50    Types: Cigars  . Smokeless tobacco: Never Used  . Alcohol use No   ROS See pertinent in HPI  Blood pressure 130/90, pulse 87, weight 209 lb 3.2 oz (94.9 kg), last menstrual period 02/28/2016, unknown if currently breastfeeding. GENERAL: Well-developed, well-nourished female in no acute distress.  ABDOMEN: Soft, nontender, nondistended.  Incision: no erhythema, induration or drainage. Small granulation tissue visualized on the left aspect of the incision PELVIC: Not indicated. EXTREMITIES: No cyanosis, clubbing, or edema, 2+ distal pulses.  A/P 21 yo s/p c-section on 4/11 here for incision check - Reassurance provided - Wound care precautions reviewed - RTC in 4 weeks for pp visit - Continue HCTZ

## 2016-12-13 NOTE — Progress Notes (Signed)
Patient is in the office for incision check. Following a c-section on 11-29-26, Pt states that on 12-08-16, the left side of the incision started to open and she could see a little pink area.

## 2017-01-03 ENCOUNTER — Other Ambulatory Visit: Payer: Self-pay | Admitting: Certified Nurse Midwife

## 2017-01-04 ENCOUNTER — Encounter: Payer: Self-pay | Admitting: Obstetrics & Gynecology

## 2017-01-04 ENCOUNTER — Other Ambulatory Visit: Payer: Self-pay

## 2017-01-04 ENCOUNTER — Ambulatory Visit (INDEPENDENT_AMBULATORY_CARE_PROVIDER_SITE_OTHER): Payer: Medicaid Other | Admitting: Obstetrics & Gynecology

## 2017-01-04 NOTE — Progress Notes (Signed)
Patient complains of headaches, blurry vision and auras.  Subjective:     Jill Madden is a 21 y.o. female who presents for a postpartum visit. She is 5 weeks postpartum following a low cervical transverse Cesarean section. I have fully reviewed the prenatal and intrapartum course. The delivery was at 39 gestational weeks. Outcome: primary cesarean section, low transverse incision. Anesthesia: epidural. Postpartum course has been UNREMARKABLE. Baby's course has been UNREMARKABLE. Baby is feeding by breast. Bleeding no bleeding. Bowel function is normal. Bladder function is normal. Patient is not sexually active. Contraception method is none. Postpartum depression screening: negative.  The following portions of the patient's history were reviewed and updated as appropriate: allergies, current medications, past family history, past medical history, past social history, past surgical history and problem list.  Review of Systems Pertinent items are noted in HPI.   Objective:    BP 130/87   Pulse 70   Wt 202 lb (91.6 kg)   LMP  (LMP Unknown)   Breastfeeding? Yes   BMI 31.17 kg/m   General:  alert, cooperative and no distress           Abdomen: soft, non-tender; bowel sounds normal; no masses,  no organomegaly and incision healing well   Vulva:  not evaluated  Vagina: not evaluated     Corpus: not examined  Adnexa:  not evaluated  Rectal Exam: Not performed.        Assessment:     normal  postpartum exam. Pap smear not done at today's visit.   Plan:    1. Contraception: IUD 2. Needs to reestablish her Medicaid, will abstain until IUD is done 3. Follow up  as needed.    Adam PhenixArnold, James G, MD

## 2017-01-04 NOTE — Patient Instructions (Signed)

## 2017-01-21 ENCOUNTER — Other Ambulatory Visit: Payer: Self-pay

## 2017-01-21 ENCOUNTER — Ambulatory Visit: Payer: Self-pay | Admitting: Obstetrics

## 2017-01-26 ENCOUNTER — Other Ambulatory Visit: Payer: Medicaid Other

## 2017-02-08 ENCOUNTER — Ambulatory Visit (INDEPENDENT_AMBULATORY_CARE_PROVIDER_SITE_OTHER): Payer: Medicaid Other | Admitting: Obstetrics & Gynecology

## 2017-02-08 ENCOUNTER — Encounter: Payer: Self-pay | Admitting: Obstetrics & Gynecology

## 2017-02-08 ENCOUNTER — Encounter: Payer: Self-pay | Admitting: *Deleted

## 2017-02-08 VITALS — BP 130/83 | HR 70 | Wt 202.7 lb

## 2017-02-08 DIAGNOSIS — Z3202 Encounter for pregnancy test, result negative: Secondary | ICD-10-CM | POA: Diagnosis not present

## 2017-02-08 DIAGNOSIS — O24410 Gestational diabetes mellitus in pregnancy, diet controlled: Secondary | ICD-10-CM

## 2017-02-08 DIAGNOSIS — Z30430 Encounter for insertion of intrauterine contraceptive device: Secondary | ICD-10-CM

## 2017-02-08 LAB — POCT URINE PREGNANCY: Preg Test, Ur: NEGATIVE

## 2017-02-08 MED ORDER — LEVONORGESTREL 18.6 MCG/DAY IU IUD
INTRAUTERINE_SYSTEM | Freq: Once | INTRAUTERINE | Status: AC
  Administered 2017-02-08: 16:00:00 via INTRAUTERINE

## 2017-02-08 NOTE — Patient Instructions (Signed)

## 2017-02-08 NOTE — Progress Notes (Signed)
   Patient presents for IUD Insertion- Liletta.  UPT is Negative.

## 2017-02-08 NOTE — Progress Notes (Signed)
    GYNECOLOGY OFFICE PROCEDURE NOTE  [name] is a 21 y.o. G2P1011 here for Liletta IUD insertion. No GYN concerns.  Last pap smear was on 06/09/16 and was normal.  IUD Insertion Procedure Note Patient identified, informed consent performed, consent signed.   Discussed risks of irregular bleeding, cramping, infection, malpositioning or misplacement of the IUD outside the uterus which may require further procedure such as laparoscopy. Time out was performed.  Urine pregnancy test negative.  Speculum placed in the vagina.  Cervix visualized.  Cleaned with Betadine x 2.  Grasped anteriorly with a single tooth tenaculum.  Uterus sounded to 8 cm.  Liletta IUD placed per manufacturer's recommendations.  Strings trimmed to 3 cm. Tenaculum was removed, good hemostasis noted.  Patient tolerated procedure well.   Patient was given post-procedure instructions.  She was advised to have backup contraception for one week.  Patient was also asked to check IUD strings periodically and follow up in 4 weeks for IUD check.   Scheryl DarterJAMES Yun Gutierrez, MD, FACOG Attending Obstetrician & Gynecologist, [facility], [facility]

## 2017-03-10 ENCOUNTER — Ambulatory Visit: Payer: Medicaid Other | Admitting: Obstetrics & Gynecology

## 2017-04-13 NOTE — Addendum Note (Signed)
Addendum  created 04/13/17 1400 by Achille Rich, MD   Sign clinical note

## 2017-04-14 ENCOUNTER — Ambulatory Visit (INDEPENDENT_AMBULATORY_CARE_PROVIDER_SITE_OTHER): Payer: Medicaid Other | Admitting: Obstetrics

## 2017-04-14 ENCOUNTER — Encounter: Payer: Self-pay | Admitting: Obstetrics

## 2017-04-14 VITALS — BP 134/90 | HR 75 | Wt 210.0 lb

## 2017-04-14 DIAGNOSIS — Z308 Encounter for other contraceptive management: Secondary | ICD-10-CM

## 2017-04-14 DIAGNOSIS — Z30431 Encounter for routine checking of intrauterine contraceptive device: Secondary | ICD-10-CM

## 2017-04-14 DIAGNOSIS — N939 Abnormal uterine and vaginal bleeding, unspecified: Secondary | ICD-10-CM

## 2017-04-14 NOTE — Progress Notes (Signed)
Patient is in the office because she is having AUB- she has had her device for 2 months and and has had almost daily bleeding since the insertion. Patient reports heavy to light flow during this time.

## 2017-04-15 ENCOUNTER — Encounter: Payer: Self-pay | Admitting: Obstetrics

## 2017-04-15 NOTE — Progress Notes (Signed)
Subjective:    Jill Madden is a 21 y.o. female who presents for contraception counseling. The patient has had irregular bleeding since insertion of IUD. The patient is sexually active. Pertinent past medical history: hypertension.  The information documented in the HPI was reviewed and verified.  Menstrual History: OB History    Gravida Para Term Preterm AB Living   2 1 1   1 1    SAB TAB Ectopic Multiple Live Births     1   0 1       No LMP recorded. Patient is not currently having periods (Reason: IUD).   Patient Active Problem List   Diagnosis Date Noted  . Status post cesarean section 12/01/2016  . GDM (gestational diabetes mellitus) 09/17/2016   Past Medical History:  Diagnosis Date  . Diabetes mellitus without complication (HCC)   . Gestational diabetes    glyburide  . Hypertension    adolescent  . Pyelonephritis     Past Surgical History:  Procedure Laterality Date  . CESAREAN SECTION N/A 11/28/2016   Procedure: CESAREAN SECTION;  Surgeon: Lazaro Arms, MD;  Location: [facility] BIRTHING SUITES;  Service: Obstetrics;  Laterality: N/A;  . NO PAST SURGERIES      No current outpatient prescriptions on file. No Known Allergies  Social History  Substance Use Topics  . Smoking status: Former Smoker    Packs/day: 0.50    Types: Cigars  . Smokeless tobacco: Never Used  . Alcohol use No    Family History  Problem Relation Age of Onset  . Diabetes Sister   . Diabetes Maternal Grandmother   . Stroke Maternal Grandmother   . Stroke Mother        Review of Systems Constitutional: negative for weight loss Genitourinary:negative for abnormal menstrual periods and vaginal discharge   Objective:   BP 134/90   Pulse 75   Wt 210 lb (95.3 kg)   Breastfeeding? No   BMI 32.41 kg/m    General:   alert  Skin:   no rash or abnormalities  Lungs:   clear to auscultation bilaterally  Heart:   regular rate and rhythm, S1, S2 normal, no murmur, click, rub or gallop   Breasts:   normal without suspicious masses, skin or nipple changes or axillary nodes  Abdomen:  normal findings: no organomegaly, soft, non-tender and no hernia  Pelvis:  External genitalia: normal general appearance Urinary system: urethral meatus normal and bladder without fullness, nontender Vaginal: normal without tenderness, induration or masses Cervix: normal appearance Adnexa: normal bimanual exam Uterus: anteverted and non-tender, normal size   Lab Review Urine pregnancy test Labs reviewed yes Radiologic studies reviewed no  50% of 10 min visit spent on counseling and coordination of care.    Assessment:    21 y.o., continuing IUD, no contraindications.   Plan:   [place] - 3 packs dispensed for cycle regulation  All questions answered. Contraception: IUD. Diagnosis explained in detail, including differential. Educational material distributed. Follow up in 3 months.  No orders of the defined types were placed in this encounter.  No orders of the defined types were placed in this encounter.

## 2017-06-23 ENCOUNTER — Ambulatory Visit: Payer: Medicaid Other | Admitting: Obstetrics and Gynecology

## 2017-10-05 ENCOUNTER — Ambulatory Visit (INDEPENDENT_AMBULATORY_CARE_PROVIDER_SITE_OTHER): Payer: 59 | Admitting: Obstetrics & Gynecology

## 2017-10-05 ENCOUNTER — Encounter: Payer: Self-pay | Admitting: Obstetrics & Gynecology

## 2017-10-05 ENCOUNTER — Other Ambulatory Visit (HOSPITAL_COMMUNITY)
Admission: RE | Admit: 2017-10-05 | Discharge: 2017-10-05 | Disposition: A | Discharge status: Home / Self Care | Payer: 59 | Source: Ambulatory Visit | Attending: Obstetrics and Gynecology | Admitting: Obstetrics and Gynecology

## 2017-10-05 VITALS — BP 140/96 | HR 76 | Ht 68.0 in | Wt 237.0 lb

## 2017-10-05 DIAGNOSIS — Z7689 Persons encountering health services in other specified circumstances: Secondary | ICD-10-CM

## 2017-10-05 DIAGNOSIS — N898 Other specified noninflammatory disorders of vagina: Secondary | ICD-10-CM | POA: Diagnosis present

## 2017-10-05 NOTE — Progress Notes (Signed)
Pt not due for AEX at this time.  Pt states that she would like to have d/c checked today.  Pt states she has had increase in d/c with some pink color.  Pt has had recent change in bleeding with IUD.  Pt would like check for BV and yeast. Pt was instructed in preforming SelfSwab.  Swab completed and will be sent to lab. Pt made aware that she may contact office for results.  Pt is also concerned about her BP.  Pt states BP elevated when last in office, has had some HA's and visual changes. Pt made aware of BP today and advised to f/u with PCP.  Pt states she does not have a PCP and would like referral today.  Referral has been entered. Agree with above note by Marya LandrySuzanne Foster

## 2017-10-06 LAB — CERVICOVAGINAL ANCILLARY ONLY
BACTERIAL VAGINITIS: NEGATIVE
Candida vaginitis: NEGATIVE

## 2017-11-09 ENCOUNTER — Ambulatory Visit (HOSPITAL_COMMUNITY)
Admission: EM | Admit: 2017-11-09 | Discharge: 2017-11-09 | Disposition: A | Discharge status: Home / Self Care | Payer: 59

## 2017-11-09 NOTE — ED Notes (Signed)
Patient is here with her son.  Patient wanted to be tested for the flu and wanted child tested.  Told patient we do not have the capability to test for the flu.  Diagnosis made based on provider assessment.  Patient not interested.  Patient wants swabbing.  Patient left with son

## 2018-08-16 ENCOUNTER — Emergency Department (HOSPITAL_COMMUNITY)
Admission: EM | Admit: 2018-08-16 | Discharge: 2018-08-16 | Disposition: A | Discharge status: Home / Self Care | Payer: 59 | Attending: Emergency Medicine | Admitting: Emergency Medicine

## 2018-08-16 ENCOUNTER — Other Ambulatory Visit: Payer: Self-pay

## 2018-08-16 ENCOUNTER — Encounter (HOSPITAL_COMMUNITY): Payer: Self-pay | Admitting: Emergency Medicine

## 2018-08-16 DIAGNOSIS — E119 Type 2 diabetes mellitus without complications: Secondary | ICD-10-CM | POA: Diagnosis not present

## 2018-08-16 DIAGNOSIS — I10 Essential (primary) hypertension: Secondary | ICD-10-CM | POA: Insufficient documentation

## 2018-08-16 DIAGNOSIS — J029 Acute pharyngitis, unspecified: Secondary | ICD-10-CM | POA: Diagnosis not present

## 2018-08-16 DIAGNOSIS — Z87891 Personal history of nicotine dependence: Secondary | ICD-10-CM | POA: Insufficient documentation

## 2018-08-16 DIAGNOSIS — R0981 Nasal congestion: Secondary | ICD-10-CM | POA: Insufficient documentation

## 2018-08-16 DIAGNOSIS — R51 Headache: Secondary | ICD-10-CM | POA: Diagnosis not present

## 2018-08-16 DIAGNOSIS — R112 Nausea with vomiting, unspecified: Secondary | ICD-10-CM | POA: Diagnosis not present

## 2018-08-16 DIAGNOSIS — R509 Fever, unspecified: Secondary | ICD-10-CM | POA: Diagnosis not present

## 2018-08-16 DIAGNOSIS — R6889 Other general symptoms and signs: Secondary | ICD-10-CM

## 2018-08-16 DIAGNOSIS — R05 Cough: Secondary | ICD-10-CM | POA: Insufficient documentation

## 2018-08-16 MED ORDER — IBUPROFEN 400 MG PO TABS: 600.0000 mg | ORAL_TABLET | Freq: Once | ORAL | Status: DC

## 2018-08-16 NOTE — ED Provider Notes (Signed)
[facility] Provider Note   CSN: [id number] Arrival date & time: 08/16/18  1859     History   Chief Complaint Chief Complaint  Patient presents with  . Cough  . Fever  . Emesis    HPI Jill Madden is a 22 y.o. female who presents with flulike symptoms.  Past medical history significant for diabetes, hypertension.  The patient reports she has had 2 days of intermittent fevers, cough, congestion, sore throat, ear fullness, cough.  Tonight when she had a fever she took Tylenol and she felt like her fever was not resolving.  She got up to go to the bathroom and had an episode of vomiting.  This is what prompted her to come to the ED.  She states that she does not feel nauseous and has not had any further vomiting.  She has had several sick contacts about a week ago.  She denies any current abdominal pain, nausea, diarrhea, urinary symptoms.  HPI  Past Medical History:  Diagnosis Date  . Diabetes mellitus without complication (HCC)   . Gestational diabetes    glyburide  . Hypertension    adolescent  . Pyelonephritis     Patient Active Problem List   Diagnosis Date Noted  . Status post cesarean section 12/01/2016  . GDM (gestational diabetes mellitus) 09/17/2016    Past Surgical History:  Procedure Laterality Date  . CESAREAN SECTION N/A 11/28/2016   Procedure: CESAREAN SECTION;  Surgeon: Lazaro ArmsLuther H Eure, MD;  [facility];  Service: Obstetrics;  Laterality: N/A;  . NO PAST SURGERIES       OB History    Gravida  2   Para  1   Term  1   Preterm      AB  1   Living  1     SAB      TAB  1   Ectopic      Multiple  0   Live Births  1            Home Medications    Prior to Admission medications   Not on File    Family History Family History  Problem Relation Age of Onset  . Diabetes Sister   . Diabetes Maternal Grandmother   . Stroke Maternal Grandmother   . Stroke Mother     Social  History Social History   Tobacco Use  . Smoking status: Former Smoker    Packs/day: 0.50    Types: Cigars  . Smokeless tobacco: Never Used  Substance Use Topics  . Alcohol use: No  . Drug use: No     Allergies   Patient has no known allergies.   Review of Systems Review of Systems  Constitutional: Positive for fever.  HENT: Positive for congestion, ear pain, rhinorrhea, sneezing and sore throat.   Respiratory: Positive for cough. Negative for shortness of breath.   Cardiovascular: Negative for chest pain.  Gastrointestinal: Positive for nausea and vomiting. Negative for abdominal pain and diarrhea.  Genitourinary: Negative for dysuria.  Neurological: Positive for headaches.     Physical Exam Updated Vital Signs BP (!) 149/100 (BP Location: Right Arm)   Pulse (!) 107   Temp 100.3 F (37.9 C) (Oral)   Ht 5\' 8"  (1.727 m)   Wt 104.3 kg   SpO2 96%   BMI 34.97 kg/m   Physical Exam Vitals signs and nursing note reviewed.  Constitutional:      General: She is  not in acute distress.    Appearance: Normal appearance. She is well-developed.     Comments: Calm and cooperative.  HENT:     Head: Normocephalic and atraumatic.     Right Ear: Hearing, tympanic membrane, ear canal and external ear normal.     Left Ear: Hearing, tympanic membrane, ear canal and external ear normal.     Nose: Mucosal edema present.     Mouth/Throat:     Mouth: Mucous membranes are moist.     Pharynx: Posterior oropharyngeal erythema present.     Tonsils: Swelling: 0 on the right. 0 on the left.  Eyes:     General: No scleral icterus.       Right eye: No discharge.        Left eye: No discharge.     Conjunctiva/sclera: Conjunctivae normal.     Pupils: Pupils are equal, round, and reactive to light.  Neck:     Musculoskeletal: Normal range of motion.  Cardiovascular:     Rate and Rhythm: Regular rhythm. Tachycardia present.  Pulmonary:     Effort: Pulmonary effort is normal. No  respiratory distress.     Breath sounds: Normal breath sounds.  Abdominal:     General: Abdomen is flat. There is no distension.     Palpations: Abdomen is soft.     Tenderness: There is no abdominal tenderness.  Skin:    General: Skin is warm and dry.  Neurological:     Mental Status: She is alert and oriented to person, place, and time.  Psychiatric:        Behavior: Behavior normal.      ED Treatments / Results  Labs (all labs ordered are listed, but only abnormal results are displayed) Labs Reviewed - No data to display  EKG None  Radiology No results found.  Procedures Procedures (including critical care time)  Medications Ordered in ED Medications  ibuprofen (ADVIL,MOTRIN) tablet 600 mg (has no administration in time range)     Initial Impression / Assessment and Plan / ED Course  I have reviewed the triage vital signs and the nursing notes.  Pertinent labs & imaging results that were available during my care of the patient were reviewed by me and considered in my medical decision making (see chart for details).  22 year old female with fever and URI symptoms.  She has a low-grade fever and is mildly tachycardic.  Exam is consistent with viral URI.  Abdomen is soft and nontender.  She does not have any further nausea and is tolerated p.o.  She was offered antiemetics and ibuprofen and declined.  Return precautions were given.  Final Clinical Impressions(s) / ED Diagnoses   Final diagnoses:  Flu-like symptoms    ED Discharge Orders    None       Bethel BornGekas,  Marie, PA-C 08/16/18 2104    Sabas SousBero, Michael M, MD 08/16/18 2332

## 2018-08-16 NOTE — ED Notes (Signed)
Patient verbalizes understanding of discharge instructions. Opportunity for questioning and answers were provided. Armband removed by staff, pt discharged from ED home via POV with family. 

## 2018-08-16 NOTE — Discharge Instructions (Addendum)
Please reast and drink plenty of fluids.  Give Tylenol or Motrin for pain/fever Return to the ED for worsening symptoms

## 2018-08-16 NOTE — ED Notes (Addendum)
Pt reports body aches, emesis, and feeling hot for 48 hours. Pt reports 1 bout of emesis in the past 24 hours but denies diarrhea. Pt never did take a temperature.

## 2018-08-16 NOTE — ED Triage Notes (Signed)
Per pt she has been having cough, congestion, fever, emesis, and body aches for the past 2 days. Pt has a fever now of 100.3 now. Pt says she is having generalized body aches.

## 2018-09-13 IMAGING — DX DG CHEST 2V
2 series · 2 of 2 positions shown · non-contrast
Comparison: None.

CLINICAL DATA: Fever, aches, cough, and congestion for 3 days.
Former smoker. History of hypertension.

EXAM:
CHEST  2 VIEW

[chest pa]
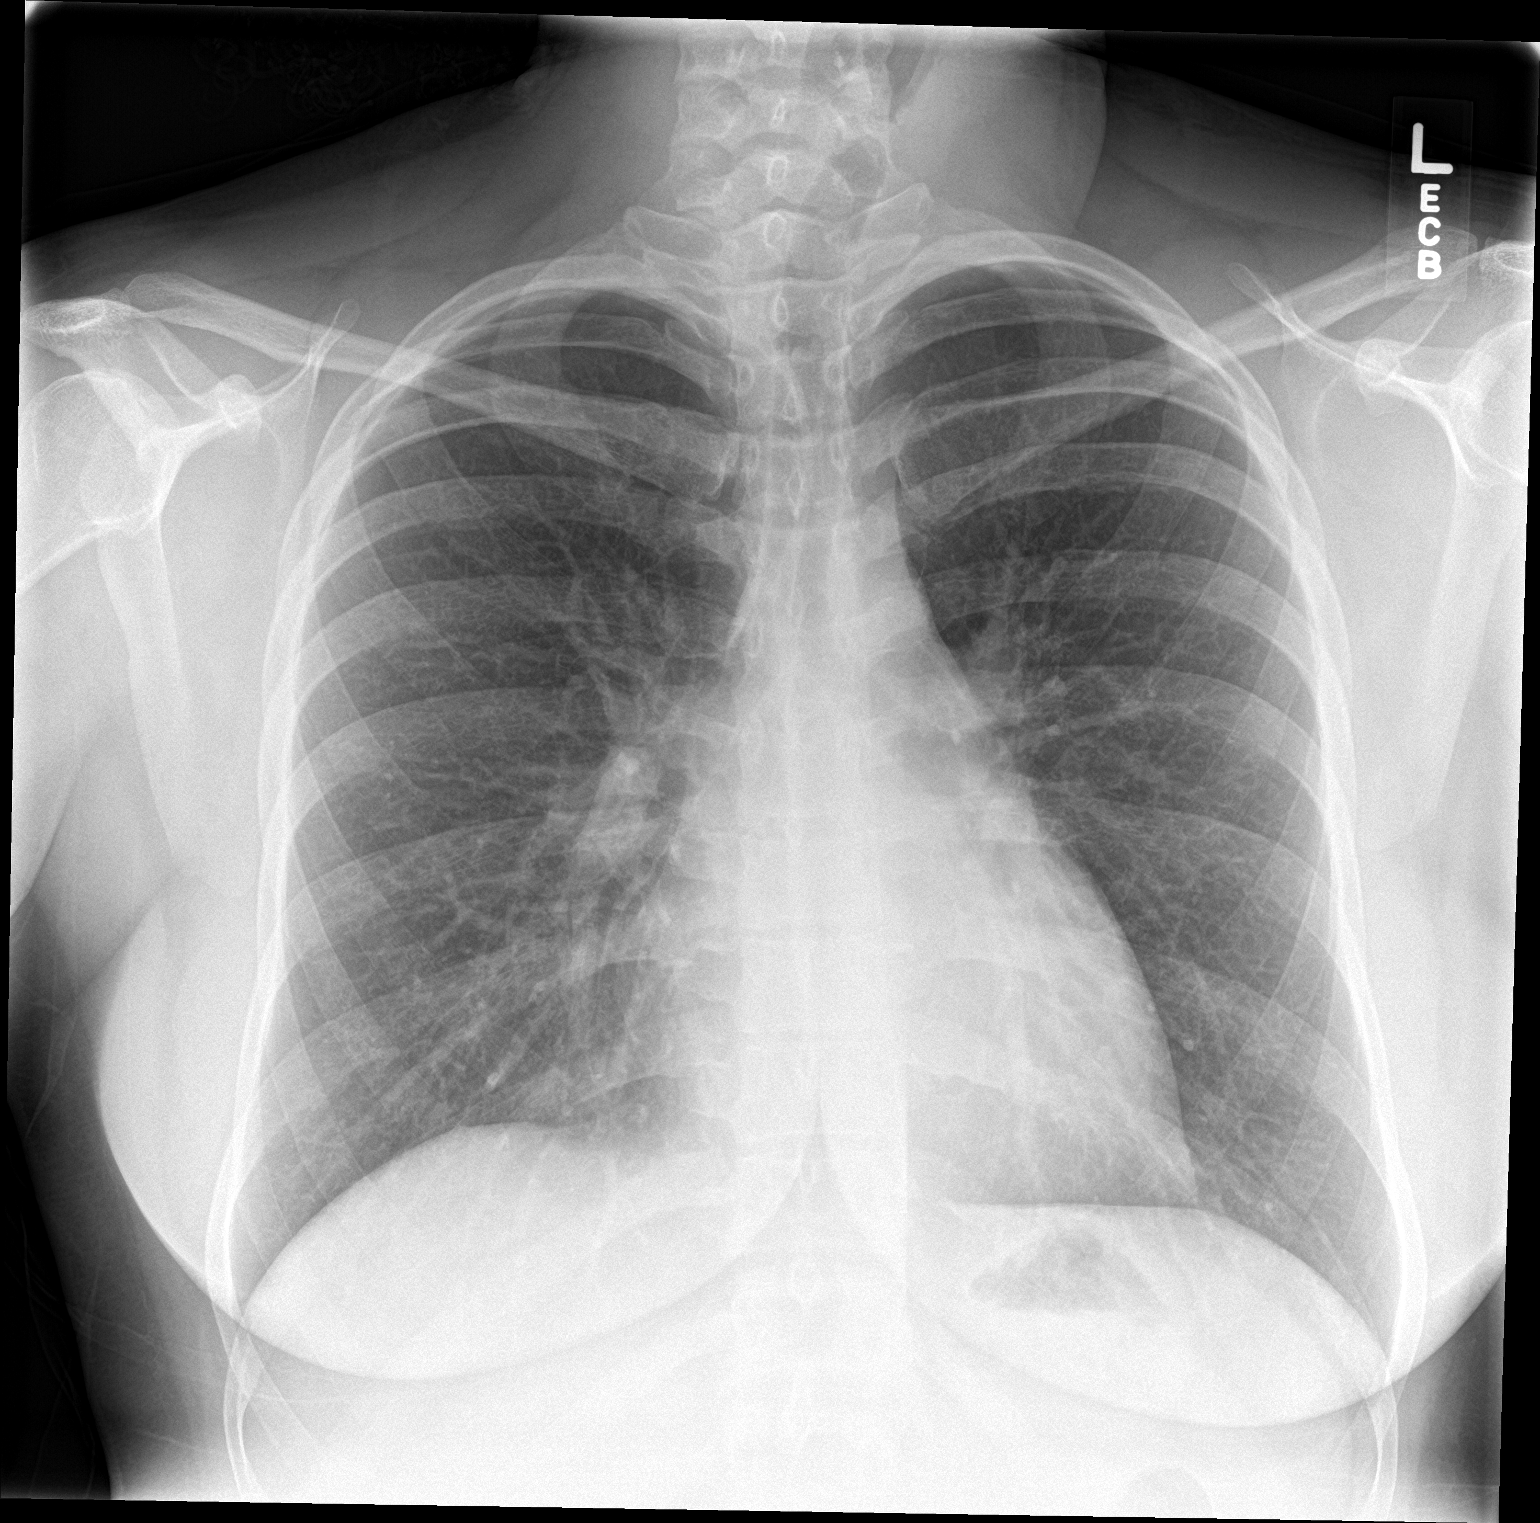

[chest lat]
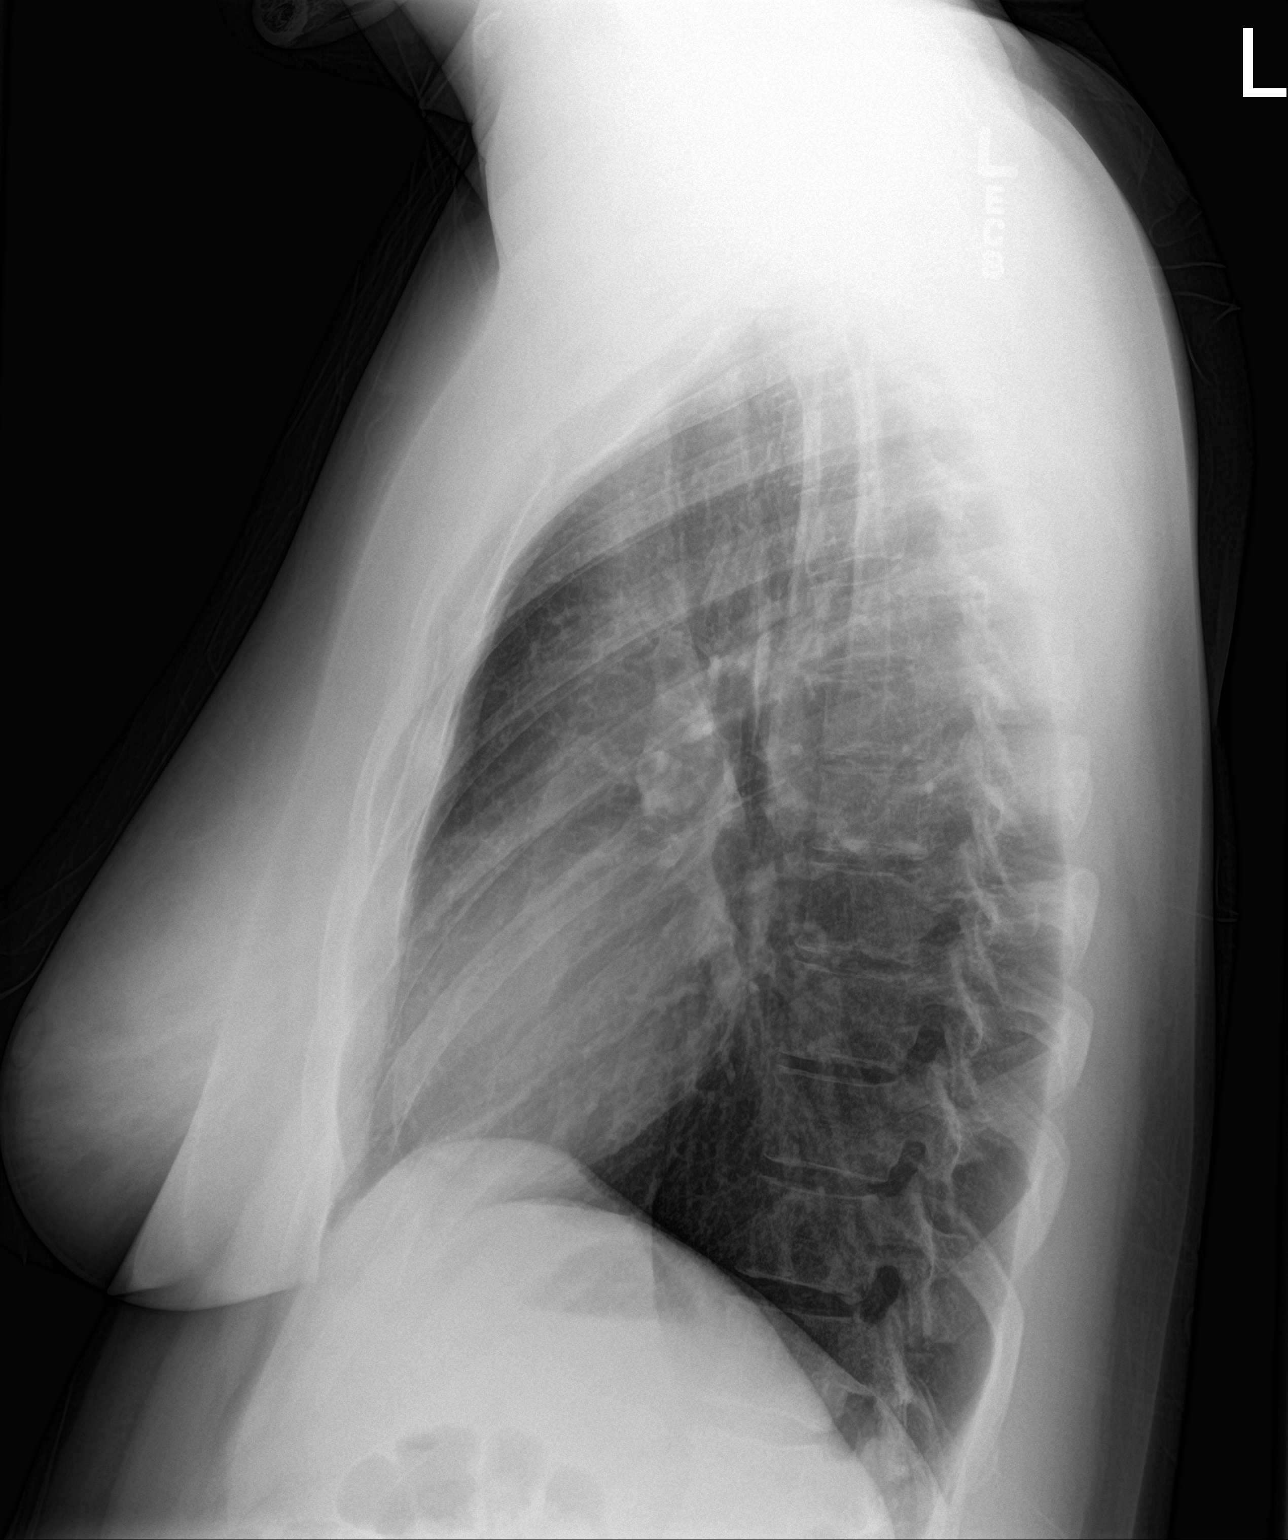

[2 of 2 positions shown; findings below may reference images not displayed]

FINDINGS: The heart size and mediastinal contours are within normal limits.
Both lungs are clear. The visualized skeletal structures are
unremarkable.
IMPRESSION: No active cardiopulmonary disease.

## 2019-01-18 IMAGING — US US MFM OB FOLLOW-UP
1 series · 13 of 28 positions shown · non-contrast
Comparison: none

[Series 1: us mfm ob follow-up · 13 of 55 slices shown]
[im 3/55]
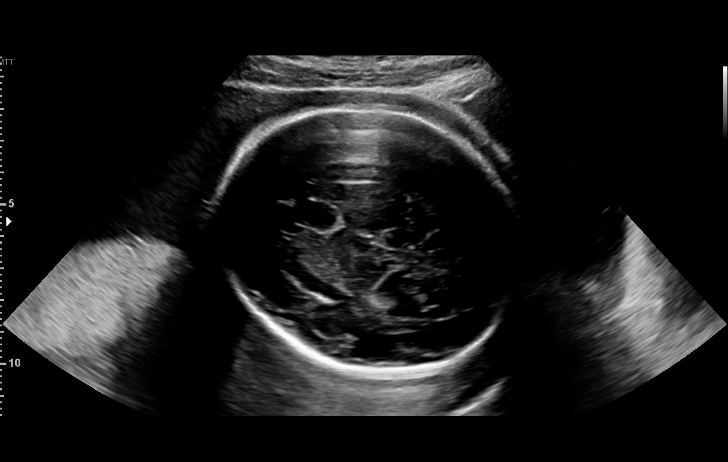
[im 7/55]
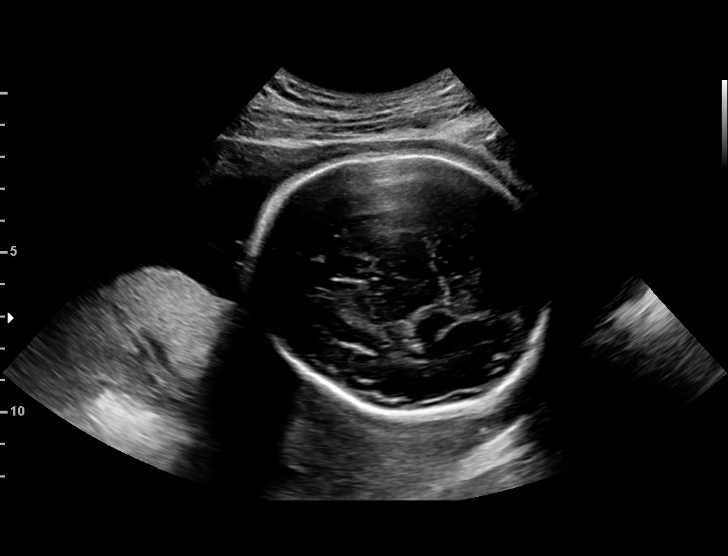
[im 11/55]
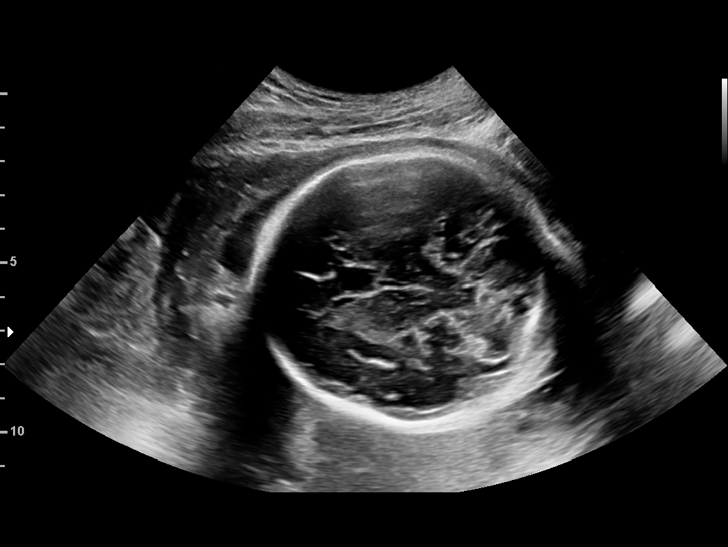
[im 15/55]
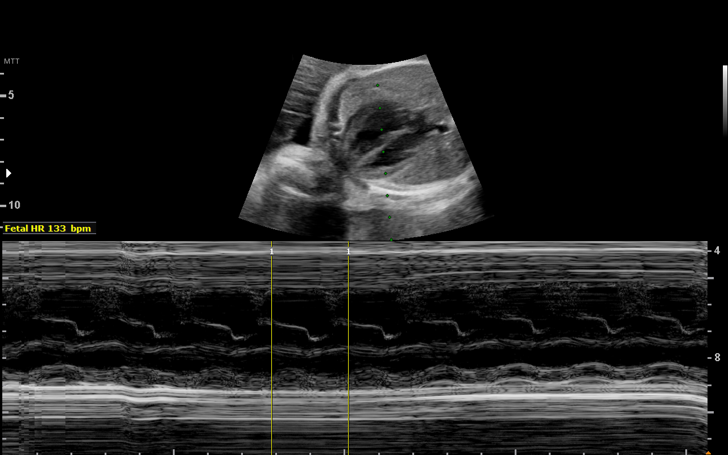
[im 19/55]
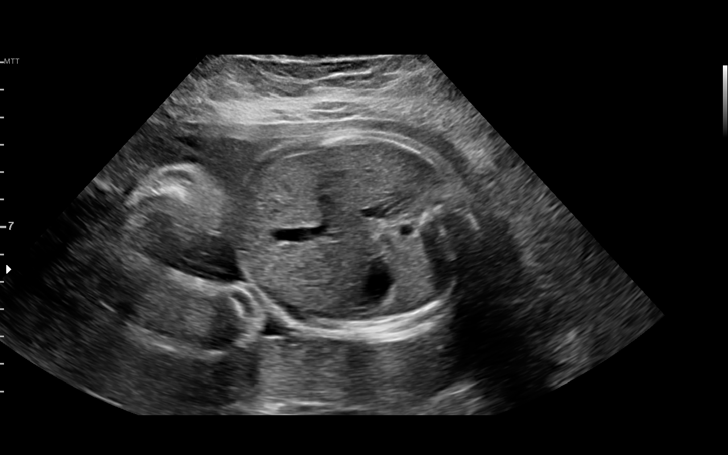
[im 23/55]
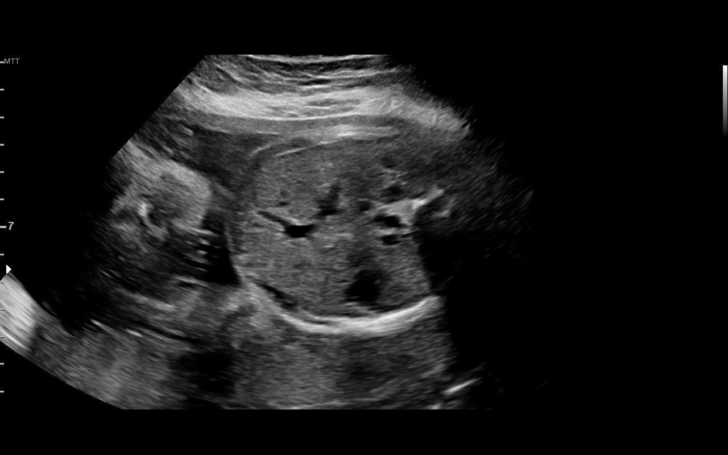
[im 29/55]
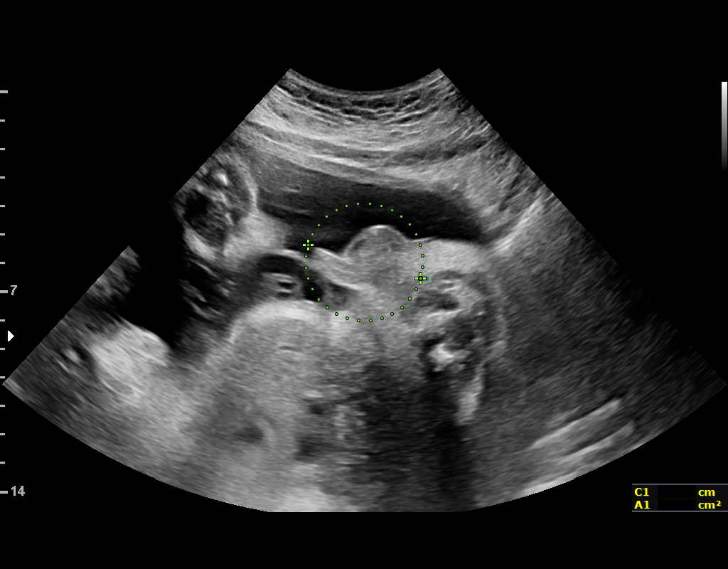
[im 33/55]
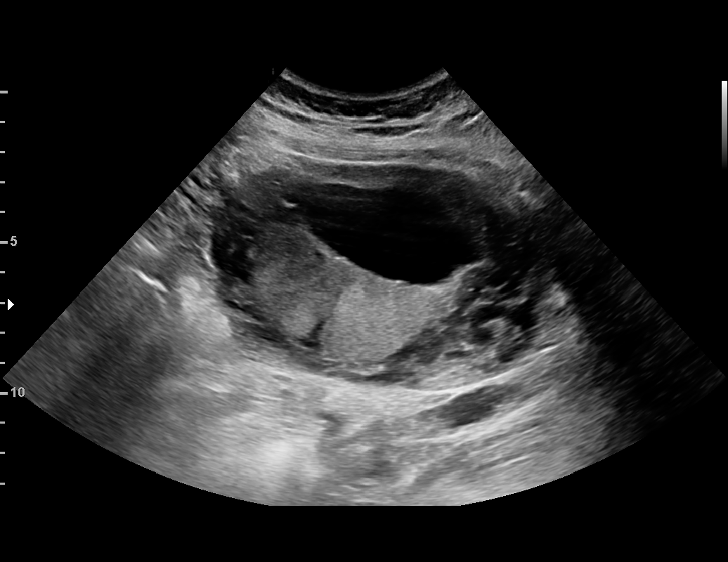
[im 37/55]
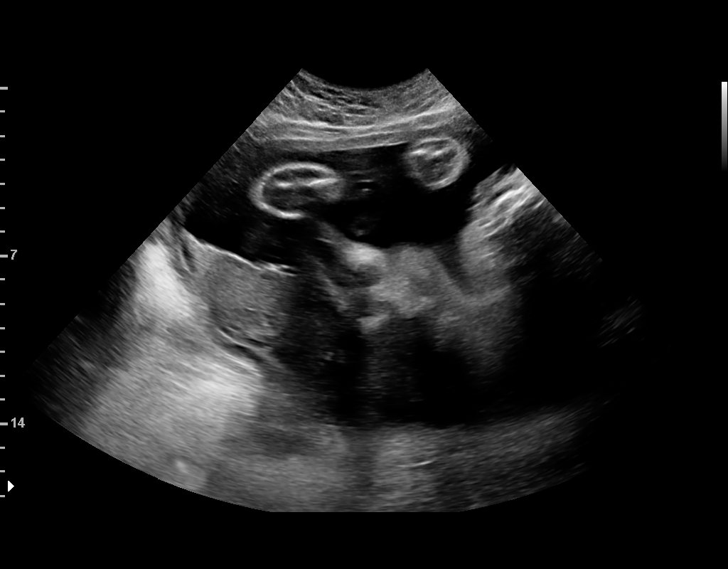
[im 41/55]
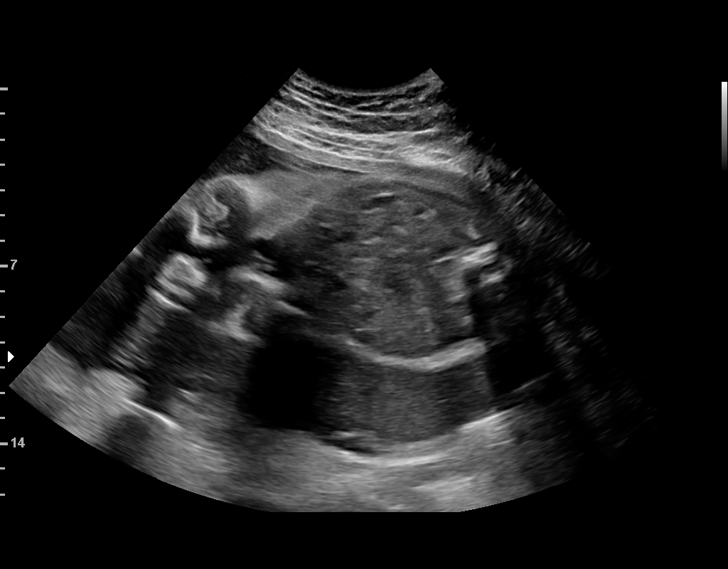
[im 45/55]
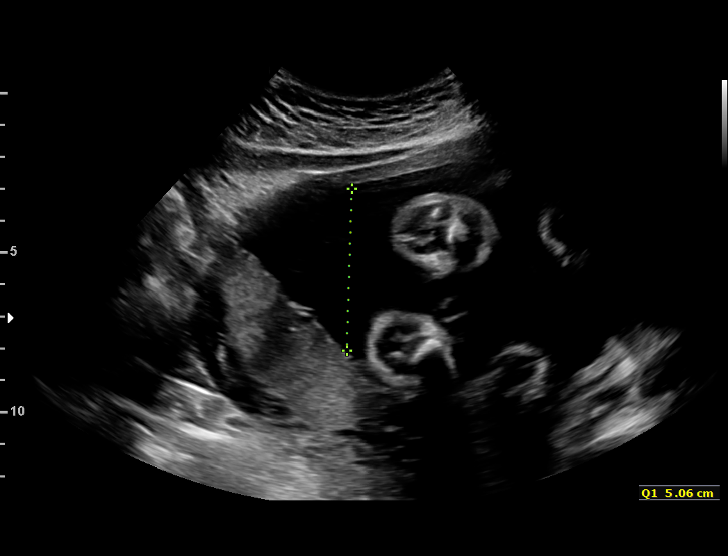
[im 49/55]
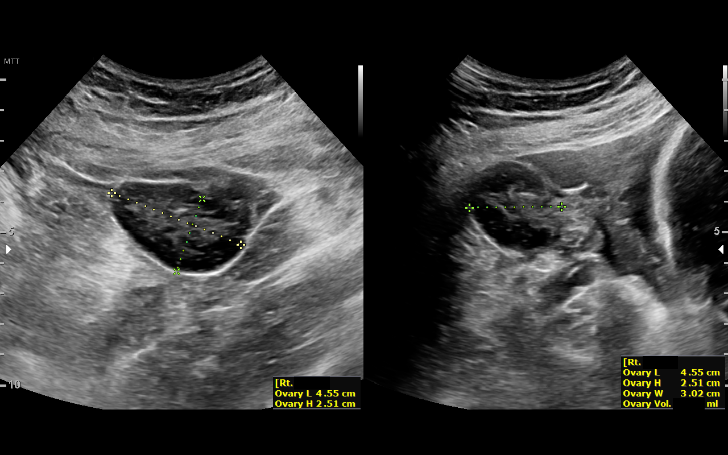
[im 53/55]
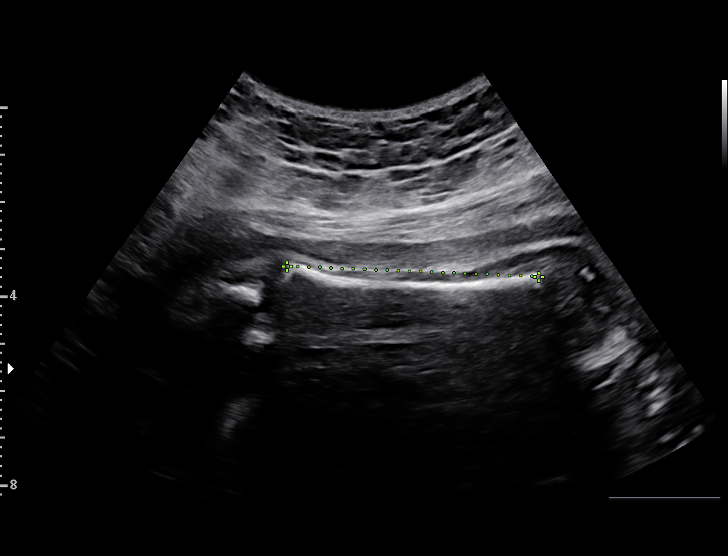

[13 of 28 positions shown; findings below may reference images not displayed]

([HOSPITAL])
[REDACTED] [HOSPITAL]

Indications

30 weeks gestation of pregnancy
Hypertension - Chronic/Pre-existing
Diabetes - Gestational
Encounter for other antenatal screening
follow-up
OB History

Gravidity:    2
TOP:          1
Fetal Evaluation

Num Of Fetuses:     1
Fetal Heart         133
Rate(bpm):
Cardiac Activity:   Observed
Presentation:       Cephalic
Placenta:           Posterior, above cervical os
P. Cord Insertion:  Previously Visualized
Amniotic Fluid
AFI FV:      Subjectively within normal limits

AFI Sum(cm)     %Tile       Largest Pocket(cm)
15.71           56

RUQ(cm)       RLQ(cm)       LUQ(cm)        LLQ(cm)
5.06
Biometry

BPD:        81  mm     G. Age:  32w 4d         88  %    CI:        82.67   %   70 - 86
FL/HC:      20.4   %   19.3 -
HC:       281   mm     G. Age:  30w 6d         18  %    HC/AC:      1.04       0.96 -
AC:      270.8  mm     G. Age:  31w 1d         60  %    FL/BPD:     70.6   %   71 - 87
FL:       57.2  mm     G. Age:  30w 0d         19  %    FL/AC:      21.1   %   20 - 24
HUM:      53.7  mm     G. Age:  31w 2d         63  %
CER:      36.8  mm     G. Age:  31w 6d         62  %

Est. FW:    8177  gm    3 lb 10 oz      57  %
Gestational Age

LMP:           30w 5d       Date:   02/28/16                 EDD:   12/04/16
U/S Today:     31w 1d                                        EDD:   12/01/16
Best:          30w 5d    Det. By:   LMP  (02/28/16)          EDD:   12/04/16
Anatomy

Cranium:               Appears normal         Aortic Arch:            Previously seen
Cavum:                 Previously seen        Ductal Arch:            Previously seen
Ventricles:            Appears normal         Diaphragm:              Appears normal
Choroid Plexus:        Previously seen        Stomach:                Appears normal, left
sided
Cerebellum:            Appears normal         Abdomen:                Previously seen
Posterior Fossa:       Previously seen        Abdominal Wall:         Previously seen
Nuchal Fold:           Not applicable (>20    Cord Vessels:           Previously seen
wks GA)
Face:                  Orbits and profile     Kidneys:                Appear normal
previously seen
Lips:                  Previously seen        Bladder:                Appears normal
Thoracic:              Previously seen        Spine:                  Previously seen
Heart:                 Appears normal         Upper Extremities:      Previously seen
(4CH, axis, and situs
RVOT:                  Previously seen        Lower Extremities:      Previously seen
LVOT:                  Previously seen

Other:  Male gender previously seen.  Heels previously seen. Technically
difficult due to advanced GA and fetal position.
Cervix Uterus Adnexa

Cervix
Normal appearance by transabdominal scan.

Uterus
No abnormality visualized.

Left Ovary
Size(cm)     3.21  x    2.65   x  3.54      Vol(ml):
Within normal limits. No adnexal mass visualized.
Right Ovary
Size(cm)     4.55  x    3.02   x  2.51      Vol(ml):
Within normal limits. No adnexal mass visualized.

Cul De Sac:   No free fluid seen.

Adnexa:       No abnormality visualized.
Impression

Singleton intrauterine pregnancy at 30 weeks 5 days
gestation with fetal cardiac activity
Cephalic presentation
Normal appearing fetal growth and amniotic fluid volume
Recommendations

Antenatal testing at 
32 weeks gestation (first ordered today
unless it will be done in her providers clinic)
Serial monthly sonograms for fetal growth

## 2019-03-15 IMAGING — US US MFM FETAL BPP W/O NON-STRESS
1 series · 14 of 28 positions shown · non-contrast
Comparison: none

([HOSPITAL])
[REDACTED]

Indications
38 weeks gestation of pregnancy
Hypertension - Chronic/Pre-existing-labetalol
Gestational diabetes in pregnancy, diet
controlled
OB History
Gravidity:    2
TOP:          1
Fetal Evaluation
Num Of Fetuses:     1
Fetal Heart         145
Rate(bpm):
Cardiac Activity:   Observed
Presentation:       Cephalic
Placenta:           Posterior, above cervical os
Amniotic Fluid
AFI FV:      Subjectively within normal limits
AFI Sum(cm)     %Tile       Largest Pocket(cm)
19.14           78
RUQ(cm)       RLQ(cm)       LUQ(cm)        LLQ(cm)
4.4
Biophysical Evaluation
Amniotic F.V:   Within normal limits       F. Tone:        Observed
F. Movement:    Observed                   Score:          [DATE]
F. Breathing:   Observed
Gestational Age
LMP:           38w 5d       Date:   02/28/16                 EDD:   12/04/16
Best:          38w 5d    Det. By:   LMP  (02/28/16)          EDD:   12/04/16
Anatomy
Cranium:               Appears normal         Aortic Arch:            Previously seen
Cavum:                 Previously seen        Ductal Arch:            Previously seen
Ventricles:            Previously seen        Diaphragm:              Appears normal
Choroid Plexus:        Previously seen        Stomach:                Appears normal, left
sided
Cerebellum:            Previously seen        Abdomen:                Appears normal
Posterior Fossa:       Previously seen        Abdominal Wall:         Previously seen
Nuchal Fold:           Not applicable (>20    Cord Vessels:           Previously seen
wks GA)
Face:                  Orbits and profile     Kidneys:                Appear normal
previously seen
Lips:                  Previously seen        Bladder:                Appears normal
Thoracic:              Previously seen        Spine:                  Previously seen
Heart:                 Appears normal         Upper Extremities:      Previously seen
(4CH, axis, and situs
RVOT:                  Previously seen        Lower Extremities:      Previously seen
LVOT:                  Previously seen
Other:  Fetus appears to be a male.  Heels previously seen. Technically
difficult due to advanced gestational age.
Cervix Uterus Adnexa
Cervix
Not visualized (advanced GA >00wks)
Uterus
No abnormality visualized.
Left Ovary
Previously seen.
Right Ovary
Previously seen
Impression
INDICATION: 21 yr old [id number] at 59w1d with chronic
hypertension and gestational diabetes A1 for BPP.

[Series 1: us mfm fetal bpp w/o non-stress · 37 acquisitions, 14 frames shown]
[im 2/37]
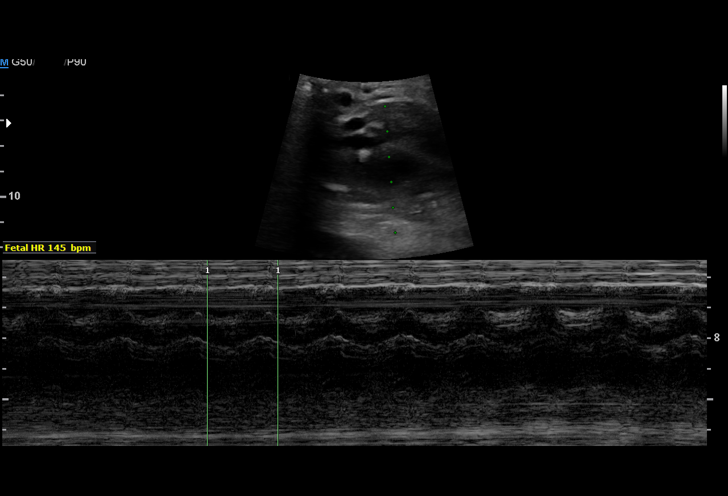
[im 5/37]
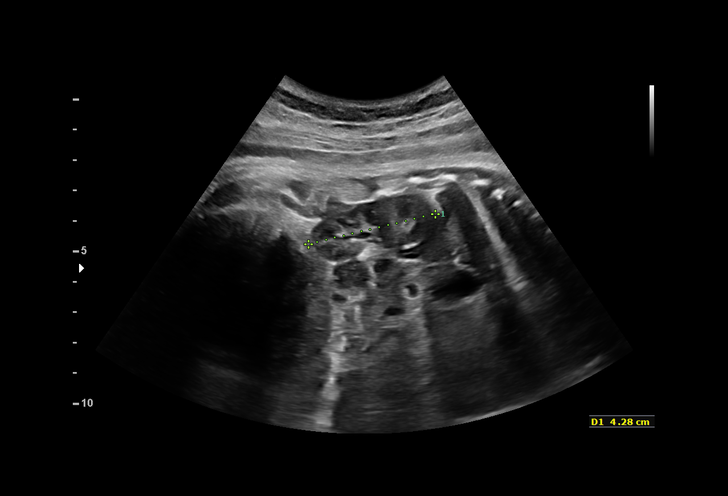
[im 7/37]
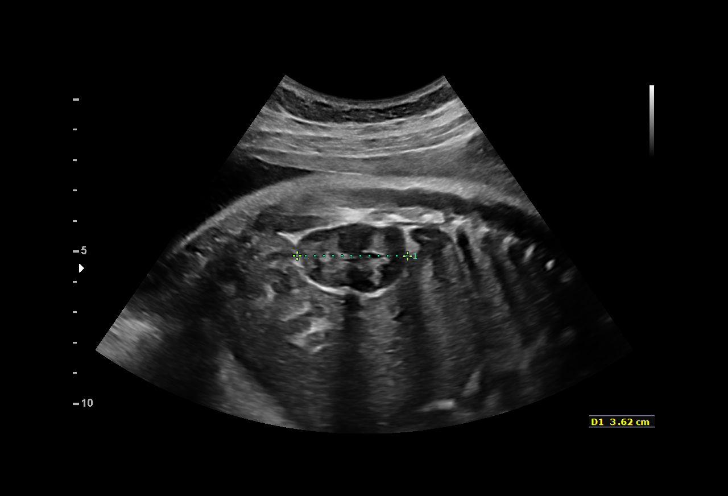
[im 10/37]
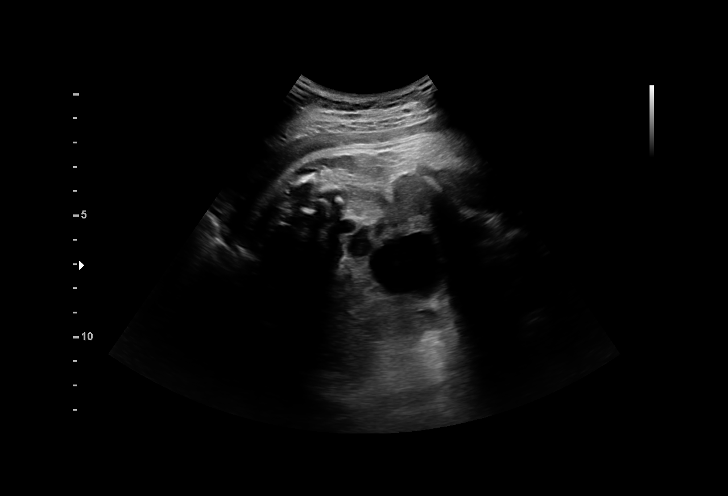
[im 13/37]
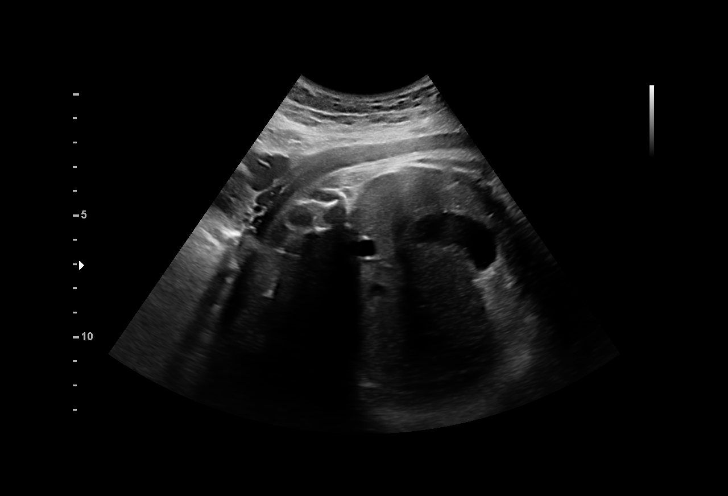
[im 15/37]
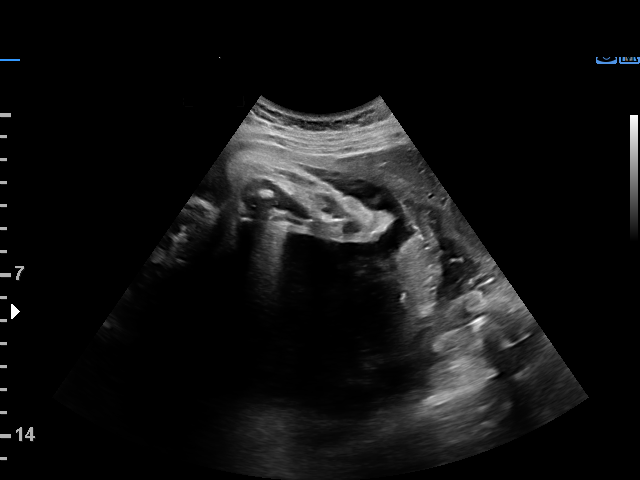
[im 18/37]
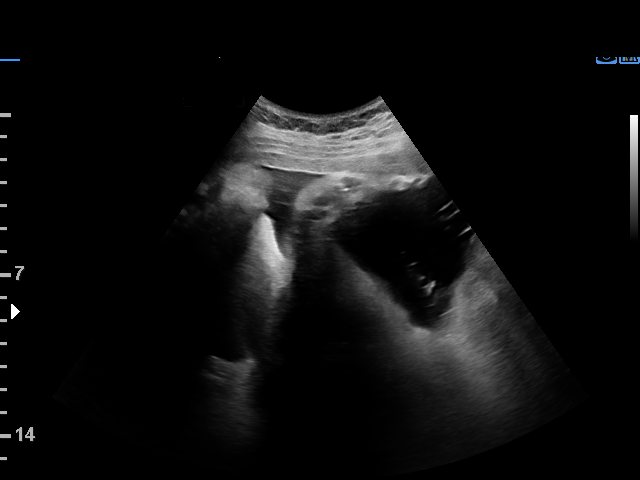
[im 21/37]
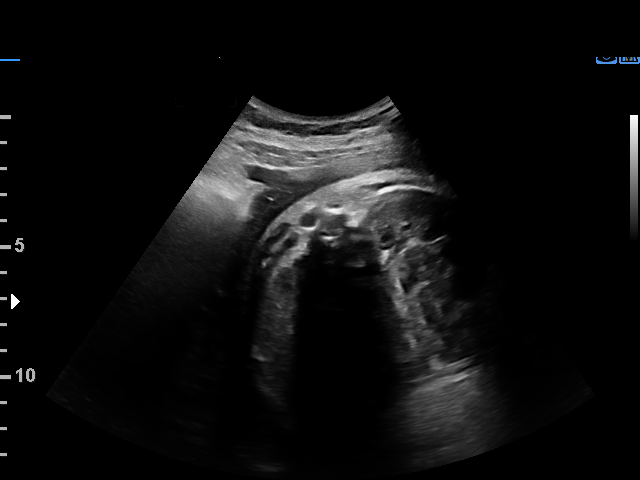
[im 23/37]
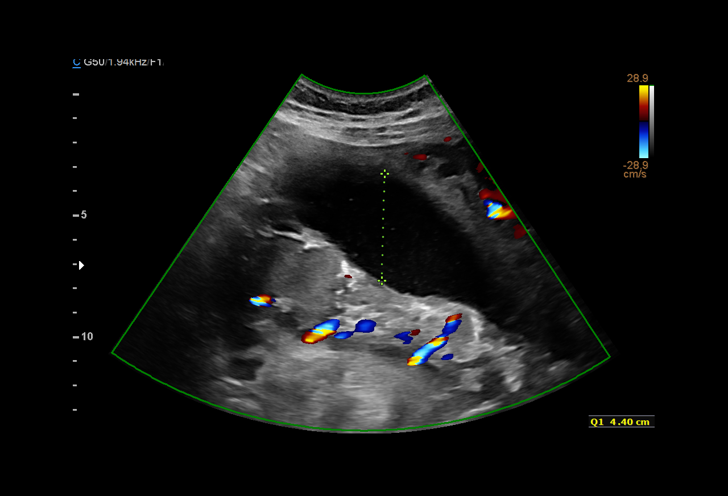
[im 26/37]
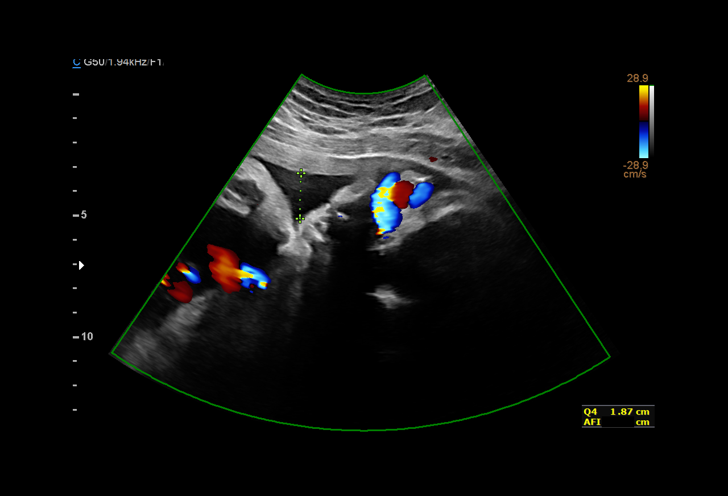
[im 29/37]
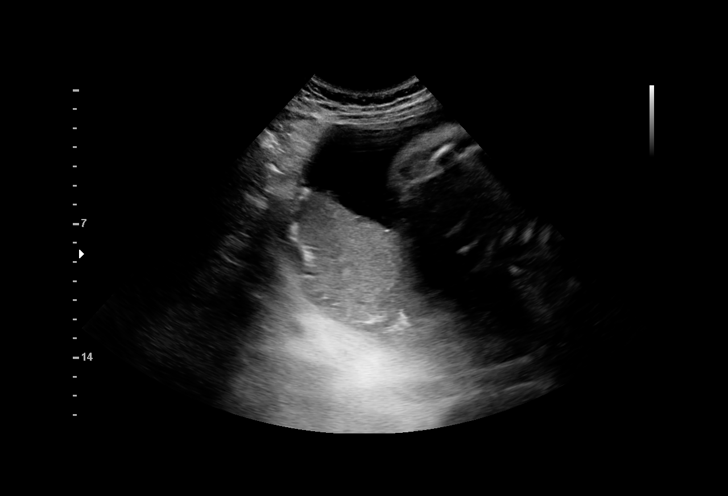
[im 31/37]
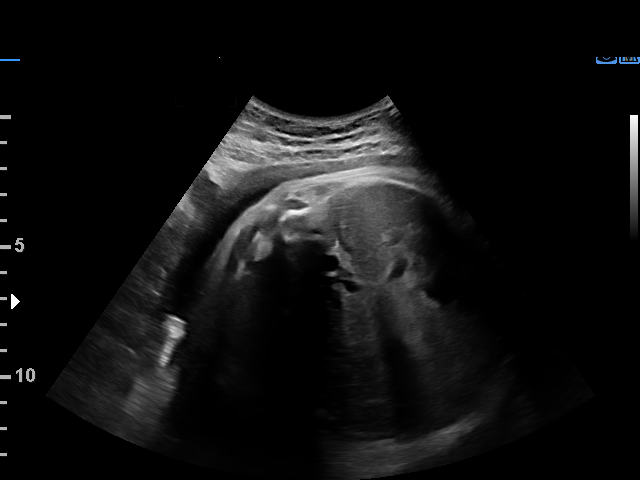
[im 34/37]
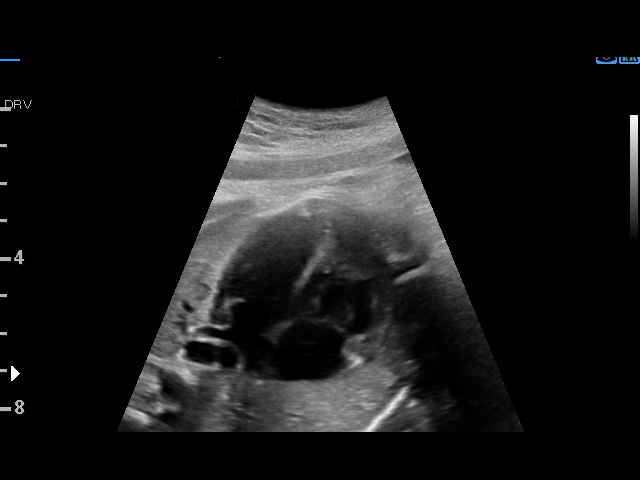
[im 37/37]
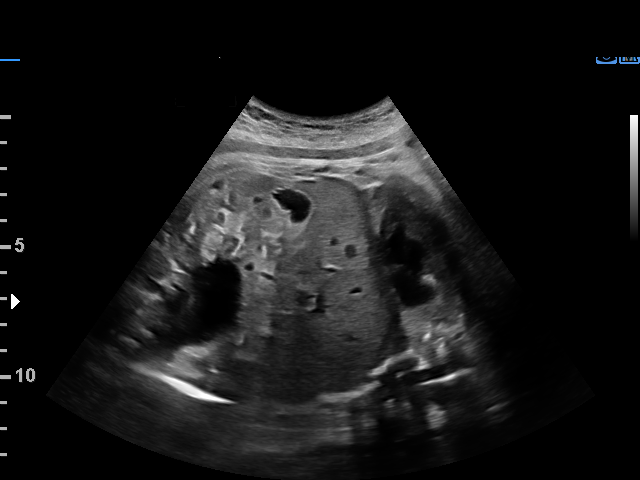

[14 of 28 positions shown; findings below may reference images not displayed]

FINDINGS: 1. Single intrauterine pregnancy.
2. Posterior placenta without evidence of previa.
3. Normal amniotic fluid index.
4. The limited anatomy survey is normal.
5. Normal biophysical profile of [DATE].
Recommendations

1. Fetal macrosomia:
- recommend caution in labor and second stage
- not projected to be >4500g at time of delivery
2. Hypertension:
- recommend continue antenatal testing
- recommend close surveillance for the development of
signs/symptoms of preeclampsia
- on labetalol
3. Gestational diabetes:
- recommend fetal surveillance as above
- currently diet controlled
4. Patient scheduled for induction at 39w
5. Low risk cell free fetal DNA

## 2019-07-16 ENCOUNTER — Ambulatory Visit (HOSPITAL_COMMUNITY)
Admission: EM | Admit: 2019-07-16 | Discharge: 2019-07-16 | Disposition: A | Discharge status: Home / Self Care | Payer: No Typology Code available for payment source | Attending: Family Medicine | Admitting: Family Medicine

## 2019-07-16 ENCOUNTER — Other Ambulatory Visit: Payer: Self-pay

## 2019-07-16 ENCOUNTER — Encounter (HOSPITAL_COMMUNITY): Payer: Self-pay | Admitting: Emergency Medicine

## 2019-07-16 DIAGNOSIS — Z20828 Contact with and (suspected) exposure to other viral communicable diseases: Secondary | ICD-10-CM | POA: Diagnosis present

## 2019-07-16 DIAGNOSIS — Z20822 Contact with and (suspected) exposure to covid-19: Secondary | ICD-10-CM

## 2019-07-16 NOTE — ED Provider Notes (Signed)
[facility]    CSN: [id number] Arrival date & time: 07/16/19  1856      History   Chief Complaint Chief Complaint  Patient presents with  . Labs Only    HPI Jill Madden is a 23 y.o. female.   HPI  Jill Madden has a stepson that spends weekends with him.  The stepson has gone back at home, but they received a call from his mother that she is positive for coronavirus.  Stepson did not have symptoms.  Patient does not have symptoms.  Her husband has a cough, they are all here getting tested  Past Medical History:  Diagnosis Date  . Diabetes mellitus without complication ([place])   . Gestational diabetes    glyburide  . Hypertension    adolescent  . Pyelonephritis     Patient Active Problem List   Diagnosis Date Noted  . Status post cesarean section 12/01/2016  . GDM (gestational diabetes mellitus) 09/17/2016    Past Surgical History:  Procedure Laterality Date  . CESAREAN SECTION N/A 11/28/2016   Procedure: CESAREAN SECTION;  Surgeon: Florian Buff, MD;  Location: [facility];  Service: Obstetrics;  Laterality: N/A;  . NO PAST SURGERIES      OB History    Gravida  2   Para  1   Term  1   Preterm      AB  1   Living  1     SAB      TAB  1   Ectopic      Multiple  0   Live Births  1            Home Medications    Prior to Admission medications   Not on File    Family History Family History  Problem Relation Age of Onset  . Diabetes Sister   . Diabetes Maternal Grandmother   . Stroke Maternal Grandmother   . Stroke Mother     Social History Social History   Tobacco Use  . Smoking status: Former Smoker    Packs/day: 0.50    Types: Cigars  . Smokeless tobacco: Never Used  Substance Use Topics  . Alcohol use: Yes  . Drug use: No     Allergies   Patient has no known allergies.   Review of Systems Review of Systems  Constitutional: Negative for chills and fever.  HENT: Negative for ear pain and sore  throat.   Eyes: Negative for pain and visual disturbance.  Respiratory: Negative for cough and shortness of breath.   Cardiovascular: Negative for chest pain and palpitations.  Gastrointestinal: Negative for abdominal pain and vomiting.  Genitourinary: Negative for dysuria and hematuria.  Musculoskeletal: Negative for arthralgias and back pain.  Skin: Negative for color change and rash.  Neurological: Negative for seizures and syncope.  All other systems reviewed and are negative.    Physical Exam   Updated Vital Signs BP (!) 149/98 (BP Location: Left Arm)   Pulse 74   Temp 98.7 F (37.1 C) (Oral)   Resp 18   SpO2 96%       Physical Exam Constitutional:      General: She is not in acute distress.    Appearance: She is well-developed.  HENT:     Head: Normocephalic and atraumatic.     Mouth/Throat:     Comments: Mask in place Eyes:     Conjunctiva/sclera: Conjunctivae normal.     Pupils: Pupils are equal, round, and  reactive to light.  Neck:     Musculoskeletal: Normal range of motion.  Cardiovascular:     Rate and Rhythm: Normal rate.  Pulmonary:     Effort: Pulmonary effort is normal. No respiratory distress.  Abdominal:     General: There is no distension.     Palpations: Abdomen is soft.  Musculoskeletal: Normal range of motion.  Skin:    General: Skin is warm and dry.  Neurological:     Mental Status: She is alert.  Psychiatric:        Mood and Affect: Mood normal.        Behavior: Behavior normal.   Patient appears   UC Treatments / Results  Labs (all labs ordered are listed, but only abnormal results are displayed) Labs Reviewed  NOVEL CORONAVIRUS, NAA (HOSP ORDER, SEND-OUT TO REF LAB; TAT 18-24 HRS)    EKG   Radiology No results found.  Procedures Procedures (including critical care time)  Medications Ordered in UC Medications - No data to display  Initial Impression / Assessment and Plan / UC Course  I have reviewed the triage  vital signs and the nursing notes.  Pertinent labs & imaging results that were available during my care of the patient were reviewed by me and considered in my medical decision making (see chart for details).     Discussed coronavirus.  Quarantine.  Testing.  Treatment.  Stay home.  Call for problems Final Clinical Impressions(s) / UC Diagnoses   Final diagnoses:  Encounter for screening laboratory testing for COVID-19 virus     Discharge Instructions     Push fluids. Tylenol for pain and fever. Quarantine until test results are available. Your results will be available on MyChart    ED Prescriptions    None     PDMP not reviewed this encounter.   Eustace Moore, MD 07/17/19 [phone]

## 2019-07-16 NOTE — ED Triage Notes (Signed)
Patient has had an exposure to covid.  Patient does not have any symptoms.  Possible exposure was Friday 07/13/2019

## 2019-07-16 NOTE — Discharge Instructions (Signed)
Push fluids. Tylenol for pain and fever. Quarantine until test results are available. Your results will be available on MyChart

## 2019-07-18 LAB — NOVEL CORONAVIRUS, NAA (HOSP ORDER, SEND-OUT TO REF LAB; TAT 18-24 HRS): SARS-CoV-2, NAA: NOT DETECTED

## 2020-04-21 DIAGNOSIS — R030 Elevated blood-pressure reading, without diagnosis of hypertension: Secondary | ICD-10-CM | POA: Diagnosis not present

## 2020-04-21 DIAGNOSIS — Z0000 Encounter for general adult medical examination without abnormal findings: Secondary | ICD-10-CM | POA: Diagnosis not present

## 2020-05-21 DIAGNOSIS — Z0000 Encounter for general adult medical examination without abnormal findings: Secondary | ICD-10-CM | POA: Diagnosis not present

## 2020-05-21 DIAGNOSIS — Z113 Encounter for screening for infections with a predominantly sexual mode of transmission: Secondary | ICD-10-CM | POA: Diagnosis not present

## 2020-05-21 DIAGNOSIS — Z124 Encounter for screening for malignant neoplasm of cervix: Secondary | ICD-10-CM | POA: Diagnosis not present

## 2020-05-21 DIAGNOSIS — N898 Other specified noninflammatory disorders of vagina: Secondary | ICD-10-CM | POA: Diagnosis not present

## 2020-05-28 DIAGNOSIS — Z23 Encounter for immunization: Secondary | ICD-10-CM | POA: Diagnosis not present

## 2020-05-28 DIAGNOSIS — I10 Essential (primary) hypertension: Secondary | ICD-10-CM | POA: Diagnosis not present

## 2020-12-02 DIAGNOSIS — J069 Acute upper respiratory infection, unspecified: Secondary | ICD-10-CM | POA: Diagnosis not present

## 2020-12-02 DIAGNOSIS — R059 Cough, unspecified: Secondary | ICD-10-CM | POA: Diagnosis not present

## 2020-12-02 DIAGNOSIS — R0981 Nasal congestion: Secondary | ICD-10-CM | POA: Diagnosis not present

## 2020-12-02 DIAGNOSIS — Z20822 Contact with and (suspected) exposure to covid-19: Secondary | ICD-10-CM | POA: Diagnosis not present

## 2020-12-02 DIAGNOSIS — J029 Acute pharyngitis, unspecified: Secondary | ICD-10-CM | POA: Diagnosis not present

## 2020-12-17 DIAGNOSIS — I10 Essential (primary) hypertension: Secondary | ICD-10-CM | POA: Diagnosis not present

## 2021-03-02 ENCOUNTER — Other Ambulatory Visit: Payer: Self-pay

## 2021-03-02 ENCOUNTER — Ambulatory Visit (INDEPENDENT_AMBULATORY_CARE_PROVIDER_SITE_OTHER): Payer: BC Managed Care – PPO | Admitting: Cardiovascular Disease

## 2021-03-02 ENCOUNTER — Encounter (HOSPITAL_BASED_OUTPATIENT_CLINIC_OR_DEPARTMENT_OTHER): Payer: Self-pay | Admitting: Cardiovascular Disease

## 2021-03-02 VITALS — BP 138/94 | HR 63 | Ht 68.0 in | Wt 245.0 lb

## 2021-03-02 DIAGNOSIS — E876 Hypokalemia: Secondary | ICD-10-CM

## 2021-03-02 DIAGNOSIS — R0683 Snoring: Secondary | ICD-10-CM | POA: Diagnosis not present

## 2021-03-02 DIAGNOSIS — I10 Essential (primary) hypertension: Secondary | ICD-10-CM | POA: Diagnosis not present

## 2021-03-02 HISTORY — DX: Essential (primary) hypertension: I10

## 2021-03-02 NOTE — Assessment & Plan Note (Addendum)
Blood pressure remains above goal on 2 medications.  She recently started HCTZ.  She has a history of hypokalemia.  We will recheck a basic metabolic panel.  Consider adding spironolactone versus switching to an alternative agent based on her potassium level.  We also discussed working on diet and exercise.  We will refer her to the PREP program through the [facility].  She will also work on limiting sodium intake to 1500 mg daily.  We have asked her to check her blood pressures twice daily and bring to follow-up with our pharmacist in 1 month.  Given that she has had high blood pressures since high school, we will also check renal artery Dopplers to evaluate for renal artery stenosis.  She had renal and and aldosterone levels checked with her PCP and were reportedly normal.  Referral for sleep study.

## 2021-03-02 NOTE — Progress Notes (Signed)
Advanced [facility]pertension [facility] Initial Assessment:    Date:  03/05/2021   ID:  Jill Madden, DOB 05/06/1996, MRN [id number]  PCP:  Shon Hale, MD  Cardiologist:  None  Nephrologist:  Referring MD: Shon Hale, *   CC: Hypertension  History of Present Illness:    Jill Madden is a 25 y.o. female with a hx of hypertension and diabetes mellitus here to establish care in the Advanced [facility]. She last saw Dr.Timberlake 11/2020. Her BP was 135/91 on HCTZ . She previously had testing for hyperaldosteronism which was normal.  When she attended high school, her blood pressure ranged around 140s/80s.  She never had a work up for early-onset HTN.  Her BP was more elevated during pregnancy and she started medicine in of September 2021. She doesn't regularly check her BP at home.  She occasionally has migraines and headaches. She does not use NSAIDs. She tends to snore when she sleeps and has not had a sleep study.  She is not well-rested in the am but thinks it may be due to her young children waking her. She doesn't exercise regularly due to her lack of motivation. She both cooks at home and orders out.  She recently got married and plans to cook more often.  She drinks alcohol twice a month and drinks coffee rarely. Patient denies chest pain, shortness of breath, abdominal pain, diarrhea, palpitations, nausea, lightheadedness, LE Edema, syncope,orthopnea or PND.   Past Medical History:  Diagnosis Date   Diabetes mellitus without complication (HCC)    Essential hypertension 03/02/2021   Gestational diabetes    glyburide   Hypertension    adolescent   Pyelonephritis     Past Surgical History:  Procedure Laterality Date   CESAREAN SECTION N/A 11/28/2016   Procedure: CESAREAN SECTION;  Surgeon: Lazaro Arms, MD;  Location: [facility] BIRTHING SUITES;  Service: Obstetrics;  Laterality: N/A;   NO PAST SURGERIES      Current Medications: Current Meds  Medication Sig    AMLODIPINE BENZOATE PO Take 10 mg by mouth daily.   ELDERBERRY PO Take 1 tablet by mouth daily.   hydrochlorothiazide (HYDRODIURIL) 25 MG tablet Take 25 mg by mouth daily.   levonorgestrel (LILETTA) 20.1 MCG/DAY IUD by Intrauterine route.   Multiple Vitamin (MULTIVITAMIN) capsule Take 1 capsule by mouth daily.     Allergies:   Patient has no known allergies.   Social History   Socioeconomic History   Marital status: Married    Spouse name: Not on file   Number of children: Not on file   Years of education: Not on file   Highest education level: Not on file  Occupational History   Not on file  Tobacco Use   Smoking status: Former    Packs/day: 0.50    Types: Cigars, Cigarettes   Smokeless tobacco: Never  Substance and Sexual Activity   Alcohol use: Yes   Drug use: No   Sexual activity: Yes    Partners: Male    Birth control/protection: I.U.D.  Other Topics Concern   Not on file  Social History Narrative   Not on file   Social Determinants of Health   Financial Resource Strain: Low Risk    Difficulty of Paying Living Expenses: Not hard at all  Food Insecurity: No Food Insecurity   Worried About Running Out of Food in the Last Year: Never true   Ran Out of Food in the Last Year: Never true  Transportation Needs: No  Transportation Needs   Lack of Transportation (Medical): No   Lack of Transportation (Non-Medical): No  Physical Activity: Inactive   Days of Exercise per Week: 0 days   Minutes of Exercise per Session: 0 min  Stress: No Stress Concern Present   Feeling of Stress : Not at all  Social Connections: Moderately Integrated   Frequency of Communication with Friends and Family: Three times a week   Frequency of Social Gatherings with Friends and Family: Three times a week   Attends Religious Services: More than 4 times per year   Active Member of Clubs or Organizations: No   Attends Banker Meetings: Never   Marital Status: Married     Family  History: The patient's family history includes Diabetes in her maternal grandmother and sister; Hypertension in her brother, maternal grandmother, mother, and sister; Stroke in her maternal aunt, maternal grandmother, and mother.  ROS:   Please see the history of present illness.    (+) headache (+) migraines All other systems reviewed and are negatives  EKGs/Labs/Other Studies Reviewed:    No prior CV studies available.  EKG:   03/02/2021: NSR,HR  63 bpm   Recent Labs: No results found for requested labs within last 8760 hours.   Recent Lipid Panel No results found for: CHOL, TRIG, HDL, CHOLHDL, VLDL, LDLCALC, LDLDIRECT  Physical Exam:   VS:  BP (!) 138/94 (BP Location: Right Arm, Patient Position: Sitting)   Pulse 63   Ht 5\' 8"  (1.727 m)   Wt 245 lb (111.1 kg)   BMI 37.25 kg/m  , BMI Body mass index is 37.25 kg/m. GENERAL:  Well appearing HEENT: Pupils equal round and reactive, fundi not visualized, oral mucosa unremarkable NECK:  No jugular venous distention, waveform within normal limits, carotid upstroke brisk and symmetric, no bruits LUNGS:  Clear to auscultation bilaterally HEART:  RRR.  PMI not displaced or sustained,S1 and S2 within normal limits, no S3, no S4, no clicks, no rubs, no murmurs ABD:  Flat, positive bowel sounds normal in frequency in pitch, no bruits, no rebound, no guarding, no midline pulsatile mass, no hepatomegaly, no splenomegaly EXT:  2 plus pulses throughout, no edema, no cyanosis no clubbing SKIN:  No rashes no nodules NEURO:  Cranial nerves II through XII grossly intact, motor grossly intact throughout PSYCH:  Cognitively intact, oriented to person place and time   ASSESSMENT/PLAN:    Essential hypertension Blood pressure remains above goal on 2 medications.  She recently started HCTZ.  She has a history of hypokalemia.  We will recheck a basic metabolic panel.  Consider adding spironolactone versus switching to an alternative agent based  on her potassium level.  We also discussed working on diet and exercise.  We will refer her to the PREP program through the [facility].  She will also work on limiting sodium intake to 1500 mg daily.  We have asked her to check her blood pressures twice daily and bring to follow-up with our pharmacist in 1 month.  Given that she has had high blood pressures since high school, we will also check renal artery Dopplers to evaluate for renal artery stenosis.  She had renal and and aldosterone levels checked with her PCP and were reportedly normal.  Referral for sleep study.   Screening for Secondary Hypertension:  Causes 03/02/2021  Drugs/Herbals Screened  Renovascular HTN Screened     - Comments Check renal artery Dopplers  Sleep Apnea Screened     - Comments snores,  no daytime somnolence  Thyroid Disease Screened     - Comments TSH normal 03/2020  Hyperaldosteronism Screened     - Comments Within normal limits in 2022  Pheochromocytoma N/A  Cushing's Syndrome N/A  Coarctation of the Aorta Screened     - Comments BP symmetric  Compliance Screened    Relevant Labs/Studies: Basic Labs Latest Ref Rng & Units 11/29/2016 11/27/2016 11/27/2016  Sodium 135 - 145 mmol/L 135 136 136  Potassium 3.5 - 5.1 mmol/L 3.0(L) 2.8(L) 2.8(L)  Creatinine 0.44 - 1.00 mg/dL 3.41 9.62 2.29                Renovascular  03/02/2021  Renal Artery [place] Completed Yes       Disposition:    FU with PharmD in 1 month  FU with Lakai Moree C. Duke Salvia, MD, FAC, 4 months     Medication Adjustments/Labs and Tests Ordered: Current medicines are reviewed at length with the patient today.  Concerns regarding medicines are outlined above.  Orders Placed This Encounter  Procedures   Basic metabolic panel   Amb Referral To Provider Referral Exercise Program (P.R.E.P)   EKG 12-Lead   Split night study   VAS US RENAL ARTERY DUPLEX   No orders of the defined types were placed in this encounter.   I,Jada Bradford,acting as a Neurosurgeon  for Chilton Si, MD.,have documented all relevant documentation on the behalf of Chilton Si, MD,as directed by  Chilton Si, MD while in the presence of Chilton Si, MD.  I, Daray Polgar C. Duke Salvia, MD have reviewed all documentation for this visit.  The documentation of the exam, diagnosis, procedures, and orders on 03/05/2021 are all accurate and complete.    Signed, Chilton Si, MD  03/05/2021 3:59 PM    [facility]

## 2021-03-02 NOTE — Patient Instructions (Signed)
Medication Instructions:  Your physician recommends that you continue on your current medications as directed. Please refer to the Current Medication list given to you today.   Labwork: BMET SOON    Testing/Procedures: Your physician has requested that you have a renal artery duplex. During this test, an ultrasound is used to evaluate blood flow to the kidneys. Allow one hour for this exam. Do not eat after midnight the day before and avoid carbonated beverages. Take your medications as you usually do.  [facility] AT [address]  Your physician has recommended that you have a sleep study. This test records several body functions during sleep, including: brain activity, eye movement, oxygen and carbon dioxide blood levels, heart rate and rhythm, breathing rate and rhythm, the flow of air through your mouth and nose, snoring, body muscle movements, and chest and belly movement. ONCE INSURANCE HAS BEEN REVIEWED THE OFFICE WILL CALL YOU TO SCHEDULE. IF YOU DO NOT HEAR IN 2 WEEKS PLEASE CALL TO FOLLOW UP    Follow-Up: 04/06/2021 AT 11:30 AM WITH PHARM D, [address]    You will receive a phone call from the PREP exercise and nutrition program to schedule an initial assessment.   Special Instructions:   MONITOR YOUR BLOOD PRESSURE TWICE A DAY, LOG IN THE BOOK PROVIDED. BRING THE BOOK AND YOUR BLOOD PRESSURE MACHINE TO YOUR FOLLOW UP IN 1 MONTH    DASH Eating Plan DASH stands for "Dietary Approaches to Stop Hypertension." The DASH eating plan is a healthy eating plan that has been shown to reduce high blood pressure (hypertension). It may also reduce your risk for type 2 diabetes, heart disease, and stroke. The DASH eating plan may also help with weight loss. What are tips for following this plan?  General guidelines Avoid eating more than 2,300 mg (milligrams) of salt (sodium) a day. If you have hypertension, you may need to reduce your sodium intake to 1,500 mg  a day. Limit alcohol intake to no more than 1 drink a day for nonpregnant women and 2 drinks a day for men. One drink equals 12 oz of beer, 5 oz of wine, or 1 oz of hard liquor. Work with your health care provider to maintain a healthy body weight or to lose weight. Ask what an ideal weight is for you. Get at least 30 minutes of exercise that causes your heart to beat faster (aerobic exercise) most days of the week. Activities may include walking, swimming, or biking. Work with your health care provider or diet and nutrition specialist (dietitian) to adjust your eating plan to your individual calorie needs. Reading food labels  Check food labels for the amount of sodium per serving. Choose foods with less than 5 percent of the Daily Value of sodium. Generally, foods with less than 300 mg of sodium per serving fit into this eating plan. To find whole grains, look for the word "whole" as the first word in the ingredient list. Shopping Buy products labeled as "low-sodium" or "no salt added." Buy fresh foods. Avoid canned foods and premade or frozen meals. Cooking Avoid adding salt when cooking. Use salt-free seasonings or herbs instead of table salt or sea salt. Check with your health care provider or pharmacist before using salt substitutes. Do not fry foods. Cook foods using healthy methods such as baking, boiling, grilling, and broiling instead. Cook with heart-healthy oils, such as olive, canola, soybean, or sunflower oil. Meal planning Eat a balanced diet  that includes: 5 or more servings of fruits and vegetables each day. At each meal, try to fill half of your plate with fruits and vegetables. Up to 6-8 servings of whole grains each day. Less than 6 oz of lean meat, poultry, or fish each day. A 3-oz serving of meat is about the same size as a deck of cards. One egg equals 1 oz. 2 servings of low-fat dairy each day. A serving of nuts, seeds, or beans 5 times each week. Heart-healthy fats.  Healthy fats called Omega-3 fatty acids are found in foods such as flaxseeds and coldwater fish, like sardines, salmon, and mackerel. Limit how much you eat of the following: Canned or prepackaged foods. Food that is high in trans fat, such as fried foods. Food that is high in saturated fat, such as fatty meat. Sweets, desserts, sugary drinks, and other foods with added sugar. Full-fat dairy products. Do not salt foods before eating. Try to eat at least 2 vegetarian meals each week. Eat more home-cooked food and less restaurant, buffet, and fast food. When eating at a restaurant, ask that your food be prepared with less salt or no salt, if possible. What foods are recommended? The items listed may not be a complete list. Talk with your dietitian about what dietary choices are best for you. Grains Whole-grain or whole-wheat bread. Whole-grain or whole-wheat pasta. Brown rice. Orpah Cobb. Bulgur. Whole-grain and low-sodium cereals. Pita bread. Low-fat, low-sodium crackers. Whole-wheat flour tortillas. Vegetables Fresh or frozen vegetables (raw, steamed, roasted, or grilled). Low-sodium or reduced-sodium tomato and vegetable juice. Low-sodium or reduced-sodium tomato sauce and tomato paste. Low-sodium or reduced-sodium canned vegetables. Fruits All fresh, dried, or frozen fruit. Canned fruit in natural juice (without added sugar). Meat and other protein foods Skinless chicken or [place]. Ground chicken or [place]. Pork with fat trimmed off. Fish and seafood. Egg whites. Dried beans, peas, or lentils. Unsalted nuts, nut butters, and seeds. Unsalted canned beans. Lean cuts of beef with fat trimmed off. Low-sodium, lean deli meat. Dairy Low-fat (1%) or fat-free (skim) milk. Fat-free, low-fat, or reduced-fat cheeses. Nonfat, low-sodium ricotta or cottage cheese. Low-fat or nonfat yogurt. Low-fat, low-sodium cheese. Fats and oils Soft margarine without trans fats. Vegetable oil. Low-fat,  reduced-fat, or light mayonnaise and salad dressings (reduced-sodium). Canola, safflower, olive, soybean, and sunflower oils. Avocado. Seasoning and other foods Herbs. Spices. Seasoning mixes without salt. Unsalted popcorn and pretzels. Fat-free sweets. What foods are not recommended? The items listed may not be a complete list. Talk with your dietitian about what dietary choices are best for you. Grains Baked goods made with fat, such as croissants, muffins, or some breads. Dry pasta or rice meal packs. Vegetables Creamed or fried vegetables. Vegetables in a cheese sauce. Regular canned vegetables (not low-sodium or reduced-sodium). Regular canned tomato sauce and paste (not low-sodium or reduced-sodium). Regular tomato and vegetable juice (not low-sodium or reduced-sodium). Rosita Fire. Olives. Fruits Canned fruit in a light or heavy syrup. Fried fruit. Fruit in cream or butter sauce. Meat and other protein foods Fatty cuts of meat. Ribs. Fried meat. Tomasa Blase. Sausage. Bologna and other processed lunch meats. Salami. Fatback. Hotdogs. Bratwurst. Salted nuts and seeds. Canned beans with added salt. Canned or smoked fish. Whole eggs or egg yolks. Chicken or [place] with skin. Dairy Whole or 2% milk, cream, and half-and-half. Whole or full-fat cream cheese. Whole-fat or sweetened yogurt. Full-fat cheese. Nondairy creamers. Whipped toppings. Processed cheese and cheese spreads. Fats and oils Butter. Stick margarine. Lard. Shortening. Ghee.  Bacon fat. Tropical oils, such as coconut, palm kernel, or palm oil. Seasoning and other foods Salted popcorn and pretzels. Onion salt, garlic salt, seasoned salt, table salt, and sea salt. Worcestershire sauce. Tartar sauce. Barbecue sauce. Teriyaki sauce. Soy sauce, including reduced-sodium. Steak sauce. Canned and packaged gravies. Fish sauce. Oyster sauce. Cocktail sauce. Horseradish that you find on the shelf. Ketchup. Mustard. Meat flavorings and tenderizers.  Bouillon cubes. Hot sauce and Tabasco sauce. Premade or packaged marinades. Premade or packaged taco seasonings. Relishes. Regular salad dressings. Where to find more information: National Heart, Lung, and Blood Institute: PopSteam.is American Heart Association: www.heart.org Summary The DASH eating plan is a healthy eating plan that has been shown to reduce high blood pressure (hypertension). It may also reduce your risk for type 2 diabetes, heart disease, and stroke. With the DASH eating plan, you should limit salt (sodium) intake to 2,300 mg a day. If you have hypertension, you may need to reduce your sodium intake to 1,500 mg a day. When on the DASH eating plan, aim to eat more fresh fruits and vegetables, whole grains, lean proteins, low-fat dairy, and heart-healthy fats. Work with your health care provider or diet and nutrition specialist (dietitian) to adjust your eating plan to your individual calorie needs. This information is not intended to replace advice given to you by your health care provider. Make sure you discuss any questions you have with your health care provider. Document Released: 07/29/2011 Document Revised: 07/22/2017 Document Reviewed: 08/02/2016 Elsevier Patient Education  2020 [employer].

## 2021-03-05 ENCOUNTER — Encounter (HOSPITAL_BASED_OUTPATIENT_CLINIC_OR_DEPARTMENT_OTHER): Payer: Self-pay | Admitting: Cardiovascular Disease

## 2021-03-06 ENCOUNTER — Telehealth: Payer: Self-pay

## 2021-03-06 NOTE — Telephone Encounter (Signed)
Call placed to pt reference PREP referral Explained program and confirmed intereste She works 10a-7pm so needs an AM class and lives closer to the [employer] ask [employer] call her to for the next AM class start. Given my number for call back for questions.   Discussed with [employer] coach, she will contact pt.

## 2021-03-06 NOTE — Telephone Encounter (Signed)
Called to discuss next PREP program at Jill Madden, she will participate in class next week July 19 every T/Th 8-915a; will conduct assessment intake after class on July 19

## 2021-03-10 NOTE — Progress Notes (Signed)
[facility] PREP Weekly Session   Patient Details  Name: Jill Madden MRN: [id number] Date of Birth: [birth year] Age: 25 y.o. PCP: Shon Hale, MD  There were no vitals filed for this visit.   [facility] Weekly seesion - 03/10/21 1000       Weekly Session   Topic Discussed Goal setting and welcome to the program    Classes attended to date 1           Tour of facility, introduction to cardio machines   Ignacio Lowder B Advay Volante 03/10/2021, 10:23 AM

## 2021-03-10 NOTE — Progress Notes (Signed)
[facility] PREP Progress Report   Patient Details  Name: Jill Madden MRN: [id number] Date of Birth: 1995/08/31 Age: 25 y.o. PCP: Shon Hale, MD  Vitals:   03/10/21 1016  BP: 132/84  Pulse: 85  SpO2: 99%  Weight: 246 lb (111.6 kg)      [facility] Eval - 03/10/21 1000       Referral    Referring Provider Duke Salvia    Reason for referral Hypertension;Inactivity;Obesitity/Overweight    Program Start Date 03/10/21      Measurement   Waist Circumference 43 inches    Hip Circumference 46 inches    Body fat 38.7 percent      Information for Trainer   Goals --   Establish a strength training program; wt loss of 15-20 lbs by end of program.   Current Exercise YouTube    Pertinent Medical History HTN    Current Barriers work      Timed Up and Go (TUGS)   Timed Up and Go Low risk <9 seconds      Mobility and Daily Activities   I find it easy to walk up or down two or more flights of stairs. 2    I have no trouble taking out the trash. 1    I do housework such as vacuuming and dusting on my own without difficulty. 4    I can easily lift a gallon of milk (8lbs). 4    I can easily walk a mile. 4    I have no trouble reaching into high cupboards or reaching down to pick up something from the floor. 4    I do not have trouble doing out-door work such as Loss adjuster, chartered, raking leaves, or gardening. 4      Mobility and Daily Activities   I feel younger than my age. 2    I feel independent. 4    I feel energetic. 2    I live an active life.  2    I feel strong. 3    I feel healthy. 2    I feel active as other people my age. 1      How fit and strong are you.   Fit and Strong Total Score 39            Past Medical History:  Diagnosis Date   Diabetes mellitus without complication (HCC)    Essential hypertension 03/02/2021   Gestational diabetes    glyburide   Hypertension    adolescent   Pyelonephritis    Past Surgical History:  Procedure Laterality  Date   CESAREAN SECTION N/A 11/28/2016   Procedure: CESAREAN SECTION;  Surgeon: Lazaro Arms, MD;  Location: [facility] BIRTHING SUITES;  Service: Obstetrics;  Laterality: N/A;   NO PAST SURGERIES     Social History   Tobacco Use  Smoking Status Former   Packs/day: 0.50   Types: Cigars, Cigarettes  Smokeless Tobacco Never    To begin PREP at [facility] T/Th 8am-9:15 starting today July 19.   Serin Thornell B Manha Amato 03/10/2021, 10:20 AM

## 2021-03-13 ENCOUNTER — Other Ambulatory Visit: Payer: Self-pay

## 2021-03-13 ENCOUNTER — Ambulatory Visit (HOSPITAL_COMMUNITY)
Admission: RE | Admit: 2021-03-13 | Discharge: 2021-03-13 | Disposition: A | Discharge status: Home / Self Care | Payer: BC Managed Care – PPO | Source: Ambulatory Visit | Attending: Cardiovascular Disease | Admitting: Cardiovascular Disease

## 2021-03-13 DIAGNOSIS — E876 Hypokalemia: Secondary | ICD-10-CM | POA: Diagnosis not present

## 2021-03-13 DIAGNOSIS — I10 Essential (primary) hypertension: Secondary | ICD-10-CM | POA: Diagnosis not present

## 2021-03-13 LAB — BASIC METABOLIC PANEL
BUN/Creatinine Ratio: 19 (ref 9–23)
BUN: 19 mg/dL (ref 6–20)
CO2: 22 mmol/L (ref 20–29)
Calcium: 9.5 mg/dL (ref 8.7–10.2)
Chloride: 101 mmol/L (ref 96–106)
Creatinine, Ser: 1 mg/dL (ref 0.57–1.00)
Glucose: 96 mg/dL (ref 65–99)
Potassium: 4.2 mmol/L (ref 3.5–5.2)
Sodium: 140 mmol/L (ref 134–144)
eGFR: 79 mL/min/{1.73_m2} (ref >59)

## 2021-03-17 NOTE — Progress Notes (Signed)
[facility] PREP Weekly Session   Patient Details  Name: Jill Madden MRN: [id number] Date of Birth: 04-07-1996 Age: 25 y.o. PCP: Shon Hale, MD  Vitals:   03/17/21 0952  Weight: 245 lb 6.4 oz (111.3 kg)     [facility] Weekly seesion - 03/17/21 0900       Weekly Session   Topic Discussed Importance of resistance training;Other ways to be active;Water    Minutes exercised this week 130 minutes    Classes attended to date 3              [name] [name] 03/17/2021, 9:54 AM

## 2021-03-24 NOTE — Progress Notes (Signed)
[facility] PREP Weekly Session   Patient Details  Name: Jill Madden MRN: [id number] Date of Birth: 04-19-1996 Age: 25 y.o. PCP: Shon Hale, MD  Vitals:   03/24/21 1004  Weight: 245 lb 9.6 oz (111.4 kg)     [facility] Weekly seesion - 03/24/21 1000       Weekly Session   Topic Discussed Healthy eating tips    Minutes exercised this week 90 minutes    Classes attended to date 5              Brennyn Ortlieb B Tiernan Suto 03/24/2021, 10:05 AM

## 2021-03-31 NOTE — Progress Notes (Signed)
[facility] PREP Weekly Session   Patient Details  Name: Jill Madden MRN: [id number] Date of Birth: 07/06/1996 Age: 25 y.o. PCP: Shon Hale, MD  Vitals:   03/31/21 1031  Weight: 243 lb 3.2 oz (110.3 kg)     [facility] Weekly seesion - 03/31/21 0900       Weekly Session   Topic Discussed Health habits   Sugar demo   Minutes exercised this week 165 minutes    Classes attended to date 7              [name] 03/31/2021, 9:24 AM

## 2021-04-06 ENCOUNTER — Telehealth (HOSPITAL_BASED_OUTPATIENT_CLINIC_OR_DEPARTMENT_OTHER): Payer: Self-pay | Admitting: *Deleted

## 2021-04-06 ENCOUNTER — Telehealth: Payer: Self-pay

## 2021-04-06 ENCOUNTER — Ambulatory Visit: Payer: Self-pay

## 2021-04-06 NOTE — Telephone Encounter (Signed)
Advised patient Left message to call back to discuss possible pregnancy given she needs CT

## 2021-04-06 NOTE — Progress Notes (Deleted)
     04/06/2021 Jill Madden Jul 09, 1996 [id number]   HPI:  Jill Madden is a 25 y.o. female patient of Dr Duke Salvia, with a PMH below who presents today for hypertension clinic evaluation.  Patient has a long history of hypertension, starting in high school and more elevated during pregnancy.  First started taking medication in September 2021.  She never had a workup for early onset hypertension.  Renal artery dopplers were ordered and noted to have 1-59% stenosis of left renal artery.  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    Past Medical History:                   Blood Pressure Goal:  130/80  Current Medications:  Family Hx:  Social Hx:   Diet:   Exercise:   Home BP readings:   Intolerances:   Labs:    Wt Readings from Last 3 Encounters:  03/31/21 243 lb 3.2 oz (110.3 kg)  03/24/21 245 lb 9.6 oz (111.4 kg)  03/17/21 245 lb 6.4 oz (111.3 kg)   BP Readings from Last 3 Encounters:  03/10/21 132/84  03/02/21 (!) 138/94  07/16/19 (!) 149/98   Pulse Readings from Last 3 Encounters:  03/10/21 85  03/02/21 63  07/16/19 74    Current Outpatient Medications  Medication Sig Dispense Refill   AMLODIPINE BENZOATE PO Take 10 mg by mouth daily.     ELDERBERRY PO Take 1 tablet by mouth daily.     hydrochlorothiazide (HYDRODIURIL) 25 MG tablet Take 25 mg by mouth daily.     levonorgestrel (LILETTA) 20.1 MCG/DAY IUD by Intrauterine route.     Multiple Vitamin (MULTIVITAMIN) capsule Take 1 capsule by mouth daily.     No current facility-administered medications for this visit.    No Known Allergies  Past Medical History:  Diagnosis Date   Diabetes mellitus without complication (HCC)    Essential hypertension 03/02/2021   Gestational diabetes    glyburide   Hypertension    adolescent   Pyelonephritis     There were no vitals taken for this visit.  No problem-specific Assessment & Plan notes  found for this encounter.   Phillips Hay PharmD CPP [facility] [address] [place], [place] [address] [phone]

## 2021-04-06 NOTE — Telephone Encounter (Signed)
Lmom to R/s missed appt  ?

## 2021-04-06 NOTE — Telephone Encounter (Signed)
-----   Message from Chilton Si, MD sent at 04/03/2021  1:09 PM EDT ----- There is mild blockage in the left renal artery and some turbulent flow in the right artery.  Findings are suggestive of fibromuscular dysplasia.  This is when there is an abnormal narrowing in the arteries to the kidneys.  Recommend getting a CTA of the abdomen to better assess.

## 2021-04-07 NOTE — Progress Notes (Signed)
[facility] [employer] PREP Weekly Session   Patient Details  Name: Jill Madden MRN: [id number] Date of Birth: [birth year] Age: 25 y.o. PCP: Shon Hale, MD  Vitals:   04/07/21 3646  Weight: 243 lb 3.2 oz (110.3 kg)     [facility] [employer] Weekly seesion - 04/07/21 0900       Weekly Session   Topic Discussed Restaurant Eating   Salt/Sodium demo and substitutes   Minutes exercised this week 110 minutes    Classes attended to date 11              Jill Madden B Jill Madden 04/07/2021, 9:22 AM

## 2021-04-21 NOTE — Progress Notes (Signed)
[facility] [employer] PREP Weekly Session   Patient Details  Name: Jill Madden MRN: [id number] Date of Birth: [birth year] Age: 25 y.o. PCP: Shon Hale, MD  Vitals:   04/21/21 1039  Weight: 240 lb 6.4 oz (109 kg)     [facility] [employer] Weekly seesion - 04/21/21 1000       Weekly Session   Topic Discussed Expectations and non-scale victories   Halfway through program, importance of restating/revisitng goals   Classes attended to date 69              [name] [name] 04/21/2021, 10:41 AM

## 2021-05-05 NOTE — Progress Notes (Signed)
[facility] [employer] PREP Weekly Session   Patient Details  Name: Jill Madden MRN: [id number] Date of Birth: 1996-05-06 Age: 25 y.o. PCP: Shon Hale, MD  Vitals:   05/05/21 1033  Weight: 240 lb 1.6 oz (108.9 kg)     [facility] [employer] Weekly seesion - 05/05/21 1000       Weekly Session   Topic Discussed Finding support    Minutes exercised this week 110 minutes    Classes attended to date 34              [name] [name] 05/05/2021, 10:34 AM

## 2021-05-12 NOTE — Progress Notes (Signed)
[facility] [employer] PREP Weekly Session   Patient Details  Name: Jill Madden MRN: [id number] Date of Birth: 09-12-1995 Age: 25 y.o. PCP: Shon Hale, MD  Vitals:   05/12/21 1011  Weight: 238 lb 1.6 oz (108 kg)     [facility] [employer] Weekly seesion - 05/12/21 1000       Weekly Session   Topic Discussed Calorie breakdown   Membership talk w/Nick; exploring different options/opportunities for attendance/exercise next week when  regularly scheduled T/Th classes will not be held.   Minutes exercised this week 130 minutes    Classes attended to date 51              Carmelo Reidel B Lina Hitch 05/12/2021, 10:12 AM

## 2021-05-26 NOTE — Progress Notes (Signed)
[employer] PRE[facility] Weekly Session  Patient Details  Name: Jill Madden MRN: [id number] Date of Birth: Apr 11, 1996 Age: 25 y.o. PCP: Shon Hale, MD  Vitals:   05/26/21 0940  Weight: 240 lb (108.9 kg)     [employer] Weekly seesion - 05/26/21 0900       [employer] "PREP" Location   [facility] "PREP" Location [facility]      Weekly Session   Topic Discussed Hitting roadblocks   Discussed goal setting plan for next 90 days; FIT testing and final assessment next week.   Minutes exercised this week 130 minutes    Classes attended to date 27             Nyiesha Beever B Kaybree Williams 05/26/2021, 9:41 AM

## 2021-06-04 ENCOUNTER — Telehealth: Payer: Self-pay

## 2021-06-04 NOTE — Telephone Encounter (Signed)
Messages sent re: returning for final assessment visit for PREP.

## 2021-06-15 NOTE — Progress Notes (Signed)
Did not complete final assessment;  Education sessions completed: 9 Total workouts completed: 8

## 2021-07-20 ENCOUNTER — Telehealth: Payer: Self-pay

## 2021-07-20 NOTE — Telephone Encounter (Signed)
Letter has been sent to patient instructing them to call us if they are still interested in completing their sleep study. If we have not received a response from the patient within 30 days of this notice, the order will be cancelled and they will need to discuss the need for a sleep study at their next office visit.  ° °

## 2021-08-19 NOTE — Telephone Encounter (Signed)
Sleep study order deleted. Letter was previously mailed to pt 30 days ago. Pt can call back to scheduled sleep study.

## 2021-08-31 ENCOUNTER — Encounter (HOSPITAL_BASED_OUTPATIENT_CLINIC_OR_DEPARTMENT_OTHER): Payer: Self-pay | Admitting: *Deleted

## 2021-08-31 NOTE — Telephone Encounter (Signed)
Mailed letter to contact office  

## 2021-08-31 NOTE — Telephone Encounter (Signed)
Mailbox full

## 2021-09-10 ENCOUNTER — Telehealth: Payer: Self-pay | Admitting: Cardiovascular Disease

## 2021-09-10 NOTE — Telephone Encounter (Signed)
Patient is open to having the test done, but not at this time. She will call our office when she is ready to have it done!       "There is mild blockage in the left renal artery and some turbulent flow in the right artery.  Findings are suggestive of fibromuscular dysplasia.  This is when there is an abnormal narrowing in the arteries to the kidneys.  Recommend getting a CTA of the abdomen to better assess."

## 2021-09-10 NOTE — Telephone Encounter (Signed)
° °  Pt received the letter about her renal result, she is calling to get her result

## 2021-12-31 ENCOUNTER — Other Ambulatory Visit: Payer: Self-pay | Admitting: Family Medicine

## 2021-12-31 DIAGNOSIS — R200 Anesthesia of skin: Secondary | ICD-10-CM

## 2021-12-31 DIAGNOSIS — R2990 Unspecified symptoms and signs involving the nervous system: Secondary | ICD-10-CM

## 2021-12-31 DIAGNOSIS — I773 Arterial fibromuscular dysplasia: Secondary | ICD-10-CM

## 2022-01-01 ENCOUNTER — Ambulatory Visit
Admission: RE | Admit: 2022-01-01 | Discharge: 2022-01-01 | Disposition: A | Discharge status: Home / Self Care | Payer: Managed Care, Other (non HMO) | Source: Ambulatory Visit | Attending: Family Medicine | Admitting: Family Medicine

## 2022-01-01 DIAGNOSIS — I773 Arterial fibromuscular dysplasia: Secondary | ICD-10-CM

## 2022-01-01 DIAGNOSIS — R200 Anesthesia of skin: Secondary | ICD-10-CM

## 2022-01-01 DIAGNOSIS — R2990 Unspecified symptoms and signs involving the nervous system: Secondary | ICD-10-CM

## 2022-01-01 MED ORDER — GADOBENATE DIMEGLUMINE 529 MG/ML IV SOLN
20.0000 mL | Freq: Once | INTRAVENOUS | Status: AC | PRN
  Administered 2022-01-01: 20 mL via INTRAVENOUS

## 2022-01-14 ENCOUNTER — Other Ambulatory Visit: Payer: Self-pay | Admitting: Family Medicine

## 2022-01-14 DIAGNOSIS — I773 Arterial fibromuscular dysplasia: Secondary | ICD-10-CM

## 2022-02-15 ENCOUNTER — Other Ambulatory Visit: Payer: Managed Care, Other (non HMO)

## 2022-02-22 ENCOUNTER — Inpatient Hospital Stay: Admission: RE | Admit: 2022-02-22 | Payer: Managed Care, Other (non HMO) | Source: Ambulatory Visit

## 2022-03-25 ENCOUNTER — Inpatient Hospital Stay: Admission: RE | Admit: 2022-03-25 | Payer: Managed Care, Other (non HMO) | Source: Ambulatory Visit

## 2022-06-01 ENCOUNTER — Other Ambulatory Visit: Payer: Self-pay | Admitting: Nurse Practitioner

## 2022-06-01 DIAGNOSIS — N644 Mastodynia: Secondary | ICD-10-CM

## 2022-06-01 DIAGNOSIS — N6452 Nipple discharge: Secondary | ICD-10-CM

## 2022-06-04 ENCOUNTER — Ambulatory Visit
Admission: RE | Admit: 2022-06-04 | Discharge: 2022-06-04 | Disposition: A | Discharge status: Home / Self Care | Payer: Managed Care, Other (non HMO) | Source: Ambulatory Visit | Attending: Nurse Practitioner | Admitting: Nurse Practitioner

## 2022-06-04 DIAGNOSIS — N644 Mastodynia: Secondary | ICD-10-CM

## 2022-06-04 DIAGNOSIS — N6452 Nipple discharge: Secondary | ICD-10-CM

## 2022-07-27 ENCOUNTER — Other Ambulatory Visit: Payer: Managed Care, Other (non HMO)

## 2022-08-27 ENCOUNTER — Inpatient Hospital Stay: Admission: RE | Admit: 2022-08-27 | Payer: Managed Care, Other (non HMO) | Source: Ambulatory Visit

## 2023-06-24 ENCOUNTER — Other Ambulatory Visit: Payer: Self-pay | Admitting: Nurse Practitioner

## 2023-06-24 ENCOUNTER — Other Ambulatory Visit (HOSPITAL_COMMUNITY)
Admission: RE | Admit: 2023-06-24 | Discharge: 2023-06-24 | Disposition: A | Discharge status: Home / Self Care | Payer: Managed Care, Other (non HMO) | Source: Ambulatory Visit | Attending: Nurse Practitioner | Admitting: Nurse Practitioner

## 2023-06-24 DIAGNOSIS — Z124 Encounter for screening for malignant neoplasm of cervix: Secondary | ICD-10-CM | POA: Diagnosis present

## 2023-07-01 LAB — CYTOLOGY - PAP: Diagnosis: NEGATIVE

## 2023-07-18 ENCOUNTER — Other Ambulatory Visit: Payer: Self-pay | Admitting: Nurse Practitioner

## 2023-07-18 DIAGNOSIS — N6311 Unspecified lump in the right breast, upper outer quadrant: Secondary | ICD-10-CM

## 2023-08-05 ENCOUNTER — Other Ambulatory Visit: Payer: Managed Care, Other (non HMO)

## 2023-08-29 ENCOUNTER — Ambulatory Visit
Admission: RE | Admit: 2023-08-29 | Discharge: 2023-08-29 | Disposition: A | Discharge status: Home / Self Care | Payer: Managed Care, Other (non HMO) | Source: Ambulatory Visit | Attending: Nurse Practitioner | Admitting: Nurse Practitioner

## 2023-08-29 ENCOUNTER — Other Ambulatory Visit: Payer: Managed Care, Other (non HMO)

## 2023-08-29 DIAGNOSIS — N6311 Unspecified lump in the right breast, upper outer quadrant: Secondary | ICD-10-CM

## 2023-10-03 ENCOUNTER — Encounter (HOSPITAL_BASED_OUTPATIENT_CLINIC_OR_DEPARTMENT_OTHER): Payer: Self-pay | Admitting: Emergency Medicine

## 2023-10-03 ENCOUNTER — Emergency Department (HOSPITAL_BASED_OUTPATIENT_CLINIC_OR_DEPARTMENT_OTHER): Payer: Managed Care, Other (non HMO)

## 2023-10-03 ENCOUNTER — Other Ambulatory Visit: Payer: Self-pay

## 2023-10-03 ENCOUNTER — Emergency Department (HOSPITAL_BASED_OUTPATIENT_CLINIC_OR_DEPARTMENT_OTHER)
Admission: EM | Admit: 2023-10-03 | Discharge: 2023-10-03 | Disposition: A | Discharge status: Home / Self Care | Payer: Managed Care, Other (non HMO) | Attending: Emergency Medicine | Admitting: Emergency Medicine

## 2023-10-03 DIAGNOSIS — R7401 Elevation of levels of liver transaminase levels: Secondary | ICD-10-CM | POA: Diagnosis not present

## 2023-10-03 DIAGNOSIS — D72829 Elevated white blood cell count, unspecified: Secondary | ICD-10-CM | POA: Diagnosis not present

## 2023-10-03 DIAGNOSIS — N132 Hydronephrosis with renal and ureteral calculous obstruction: Secondary | ICD-10-CM | POA: Diagnosis not present

## 2023-10-03 DIAGNOSIS — N200 Calculus of kidney: Secondary | ICD-10-CM

## 2023-10-03 DIAGNOSIS — E119 Type 2 diabetes mellitus without complications: Secondary | ICD-10-CM | POA: Insufficient documentation

## 2023-10-03 DIAGNOSIS — R109 Unspecified abdominal pain: Secondary | ICD-10-CM | POA: Diagnosis present

## 2023-10-03 LAB — URINALYSIS, W/ REFLEX TO CULTURE (INFECTION SUSPECTED)
Bacteria, UA: NONE SEEN
Bilirubin Urine: NEGATIVE
Glucose, UA: NEGATIVE mg/dL
Ketones, ur: NEGATIVE mg/dL
Leukocytes,Ua: NEGATIVE
Nitrite: NEGATIVE
Protein, ur: NEGATIVE mg/dL
Specific Gravity, Urine: 1.02 (ref 1.005–1.030)
pH: 6 (ref 5.0–8.0)

## 2023-10-03 LAB — COMPREHENSIVE METABOLIC PANEL
ALT: 20 U/L (ref 0–44)
AST: 10 U/L — ABNORMAL LOW (ref 15–41)
Albumin: 4.1 g/dL (ref 3.5–5.0)
Alkaline Phosphatase: 63 U/L (ref 38–126)
Anion gap: 8 (ref 5–15)
BUN: 18 mg/dL (ref 6–20)
CO2: 25 mmol/L (ref 22–32)
Calcium: 9.2 mg/dL (ref 8.9–10.3)
Chloride: 106 mmol/L (ref 98–111)
Creatinine, Ser: 1.22 mg/dL — ABNORMAL HIGH (ref 0.44–1.00)
GFR, Estimated: >60 mL/min (ref >60)
Glucose, Bld: 139 mg/dL — ABNORMAL HIGH (ref 70–99)
Potassium: 3.7 mmol/L (ref 3.5–5.1)
Sodium: 139 mmol/L (ref 135–145)
Total Bilirubin: 0.6 mg/dL (ref 0.0–1.2)
Total Protein: 7.4 g/dL (ref 6.5–8.1)

## 2023-10-03 LAB — CBC
HCT: 36 % (ref 36.0–46.0)
Hemoglobin: 12.4 g/dL (ref 12.0–15.0)
MCH: 30.6 pg (ref 26.0–34.0)
MCHC: 34.4 g/dL (ref 30.0–36.0)
MCV: 88.9 fL (ref 80.0–100.0)
Platelets: 351 10*3/uL (ref 150–400)
RBC: 4.05 MIL/uL (ref 3.87–5.11)
RDW: 12.9 % (ref 11.5–15.5)
WBC: 11.1 10*3/uL — ABNORMAL HIGH (ref 4.0–10.5)
nRBC: 0 % (ref 0.0–0.2)

## 2023-10-03 LAB — PREGNANCY, URINE: Preg Test, Ur: NEGATIVE

## 2023-10-03 MED ORDER — OXYCODONE-ACETAMINOPHEN 5-325 MG PO TABS
1.0000 | ORAL_TABLET | Freq: Once | ORAL | Status: AC
  Administered 2023-10-03: 1 via ORAL
  Filled 2023-10-03: qty 1

## 2023-10-03 MED ORDER — ONDANSETRON HCL 4 MG PO TABS: 4.0000 mg | ORAL_TABLET | Freq: Four times a day (QID) | ORAL | 0 refills | Status: AC

## 2023-10-03 MED ORDER — ONDANSETRON 4 MG PO TBDP
4.0000 mg | ORAL_TABLET | Freq: Once | ORAL | Status: AC
  Administered 2023-10-03: 4 mg via ORAL
  Filled 2023-10-03: qty 1

## 2023-10-03 MED ORDER — TAMSULOSIN HCL 0.4 MG PO CAPS
0.4000 mg | ORAL_CAPSULE | Freq: Once | ORAL | Status: AC
Start: 2023-10-03 — End: 2023-10-03
  Administered 2023-10-03: 0.4 mg via ORAL
  Filled 2023-10-03: qty 1

## 2023-10-03 MED ORDER — OXYCODONE-ACETAMINOPHEN 5-325 MG PO TABS: 1.0000 | ORAL_TABLET | Freq: Three times a day (TID) | ORAL | 0 refills | Status: AC | PRN

## 2023-10-03 NOTE — ED Triage Notes (Addendum)
 Right side flank pain, woke up today  UC - UA +blood, probable kidney stone Sent for CT Toradol  at [facility]

## 2023-10-03 NOTE — ED Notes (Signed)
 Report given to the next RN.Marland KitchenMarland Kitchen

## 2023-10-03 NOTE — Discharge Instructions (Addendum)
 Today you are seen for a kidney stone.  Please pick up your medication and take as needed.  Please follow-up with your primary care in the upcoming days for further evaluation and treatment.  Thank you for letting us  treat you today. After reviewing your labs and imaging, I feel you are safe to go home. Please follow up with your PCP in the next several days and provide them with your records from this visit. Return to the Emergency Room if pain becomes severe or symptoms worsen.

## 2023-10-03 NOTE — ED Provider Notes (Signed)
[place] EMERGENCY DEPARTMENT AT [facility] Provider Note   CSN: [id number] Arrival date & time: 10/03/23  1423     History  Chief Complaint  Patient presents with   Flank Pain    Jill Madden is a 28 y.o. female past medical history significant for pyelonephritis and diabetes presents today for right-sided flank pain.  Patient went to urgent care where her UA showed blood.  Patient given Toradol at urgent care.  Patient states she had a similar episode a few weeks ago but it passed much quicker so she thought it was gas.  Patient denies hematuria, dysuria, fever, vomiting, chills or urgency.  Patient does endorse frequency and nausea.  Patient describes the pain as dull but sharp at times.   Flank Pain       Home Medications Prior to Admission medications   Medication Sig Start Date End Date Taking? Authorizing Provider  ondansetron (ZOFRAN) 4 MG tablet Take 1 tablet (4 mg total) by mouth every 6 (six) hours for 5 days. 10/03/23 10/08/23 Yes Dolphus Jenny, PA-C  oxyCODONE-acetaminophen (PERCOCET/ROXICET) 5-325 MG tablet Take 1 tablet by mouth every 8 (eight) hours as needed for up to 5 days for severe pain (pain score 7-10). 10/03/23 10/08/23 Yes Dolphus Jenny, PA-C  AMLODIPINE BENZOATE PO Take 10 mg by mouth daily. 04/23/20   [provider]  ELDERBERRY PO Take 1 tablet by mouth daily.    [provider]  hydrochlorothiazide (HYDRODIURIL) 25 MG tablet Take 25 mg by mouth daily. 12/17/20   [provider]  levonorgestrel (LILETTA) 20.1 MCG/DAY IUD by Intrauterine route.    [provider]  Multiple Vitamin (MULTIVITAMIN) capsule Take 1 capsule by mouth daily.    [provider]      Allergies    Patient has no known allergies.    Review of Systems   Review of Systems  Genitourinary:  Positive for flank pain.    Physical Exam Updated Vital Signs BP (!) 156/100 (BP Location: Right Arm)   Pulse 81   Temp 98.5 F (36.9  C) (Oral)   Resp 20   SpO2 100%  Physical Exam Vitals and nursing note reviewed.  Constitutional:      General: She is not in acute distress.    Appearance: Normal appearance. She is well-developed. She is not toxic-appearing or diaphoretic.  HENT:     Head: Normocephalic and atraumatic.     Right Ear: External ear normal.     Left Ear: External ear normal.     Mouth/Throat:     Mouth: Mucous membranes are moist.  Eyes:     Extraocular Movements: Extraocular movements intact.     Conjunctiva/sclera: Conjunctivae normal.  Cardiovascular:     Rate and Rhythm: Normal rate and regular rhythm.     Pulses: Normal pulses.     Heart sounds: Normal heart sounds. No murmur heard. Pulmonary:     Effort: Pulmonary effort is normal. No respiratory distress.     Breath sounds: Normal breath sounds.  Abdominal:     General: Bowel sounds are normal.     Palpations: Abdomen is soft.     Tenderness: There is abdominal tenderness in the right lower quadrant and suprapubic area. There is right CVA tenderness. There is no guarding. Negative signs include Murphy's sign, Rovsing's sign and McBurney's sign.  Musculoskeletal:        General: No swelling.     Cervical back: Neck supple.  Skin:  General: Skin is warm and dry.     Capillary Refill: Capillary refill takes less than 2 seconds.  Neurological:     General: No focal deficit present.     Mental Status: She is alert.     Motor: No weakness.  Psychiatric:        Mood and Affect: Mood normal.     ED Results / Procedures / Treatments   Labs (all labs ordered are listed, but only abnormal results are displayed) Labs Reviewed  URINALYSIS, W/ REFLEX TO CULTURE (INFECTION SUSPECTED) - Abnormal; Notable for the following components:      Result Value   Hgb urine dipstick SMALL (*)    All other components within normal limits  COMPREHENSIVE METABOLIC PANEL - Abnormal; Notable for the following components:   Glucose, Bld 139 (*)     Creatinine, Ser 1.22 (*)    AST 10 (*)    All other components within normal limits  CBC - Abnormal; Notable for the following components:   WBC 11.1 (*)    All other components within normal limits  PREGNANCY, URINE    EKG None  Radiology CT Renal Stone Study Result Date: 10/03/2023 CLINICAL DATA:  Abdominal/flank pain.  Stone suspected.  Hematuria. EXAM: CT ABDOMEN AND PELVIS WITHOUT CONTRAST TECHNIQUE: Multidetector CT imaging of the abdomen and pelvis was performed following the standard protocol without IV contrast. RADIATION DOSE REDUCTION: This exam was performed according to the departmental dose-optimization program which includes automated exposure control, adjustment of the mA and/or kV according to patient size and/or use of iterative reconstruction technique. COMPARISON:  None Available. FINDINGS: Lower chest: Normal Hepatobiliary: Normal Pancreas: Normal Spleen: Normal Adrenals/Urinary Tract: Adrenal glands are normal. Left kidney is normal. No stone or hydronephrosis. Right kidney shows swelling with 2 or 3 punctate nonobstructing stones within the kidney. There is hydroureteronephrosis with the left ureter being dilated all the way to the UVJ where there is a 3 x 4 mm stone. No stone in the bladder. Stomach/Bowel: Stomach and small intestine are normal. Normal appendix. Normal colon. Vascular/Lymphatic: No vascular or nodal abnormality. Reproductive: Normal Other: No free fluid or air. Musculoskeletal: Normal IMPRESSION: 3 x 4 mm stone at the right UVJ with moderate hydroureteronephrosis. Swelling of the right kidney with an additional 2 or 3 punctate nonobstructing stones within the kidney. Electronically Signed   By: Paulina Fusi M.D.   On: 10/03/2023 21:25    Procedures Procedures    Medications Ordered in ED Medications  ondansetron (ZOFRAN-ODT) disintegrating tablet 4 mg (4 mg Oral Given 10/03/23 1906)  oxyCODONE-acetaminophen (PERCOCET/ROXICET) 5-325 MG per tablet 1  tablet (1 tablet Oral Given 10/03/23 1905)  tamsulosin (FLOMAX) capsule 0.4 mg (0.4 mg Oral Given 10/03/23 2141)    ED Course/ Medical Decision Making/ A&P                                 Medical Decision Making Risk Prescription drug management.   This patient presents to the ED with chief complaint(s) of right flank pain with pertinent past medical history of pyelonephritis which further complicates the presenting complaint. The complaint involves an extensive differential diagnosis and also carries with it a high risk of complications and morbidity.    The differential diagnosis includes UTI, kidney stone, pyelonephritis  Additional history obtained: Records reviewed Care Everywhere/External Records  ED Course and Reassessment: Patient given Zofran and Percocet for nausea and pain Patient given  Flomax  Independent labs interpretation:  The following labs were independently interpreted:  CBC: Mild leukocytosis at 11.1 CMP: Elevated troponin 1.22, decreased AST at 10 UA: Small hemoglobin Urine pregnancy: Negative  Independent visualization of imaging: - I independently visualized the following imaging with scope of interpretation limited to determining acute life threatening conditions related to emergency care: CT renal stone study, which revealed 3 x 4 mm stone at the right UVJ with moderate hydroureteronephrosis.  Swelling to the right kidney with an additional 2 or 3 punctate nonobstructing stones within the kidney.  Consultation: - Consulted or discussed management/test interpretation w/ external professional: None  Consideration for admission or further workup: Stated for admission or further workup however patient's vital signs, physical exam, labs, and imaging were reassuring.  Patient's symptoms likely due to kidney stone.  Patient given one-time dose of Flomax while in ED.  Patient given outpatient pain management with short course of Percocet and Zofran as needed for  nausea.  Patient should follow-up with primary care in the upcoming days for further evaluation and treatment.        Final Clinical Impression(s) / ED Diagnoses Final diagnoses:  Kidney stone    Rx / DC Orders ED Discharge Orders          Ordered    oxyCODONE-acetaminophen (PERCOCET/ROXICET) 5-325 MG tablet  Every 8 hours PRN        10/03/23 2150    ondansetron (ZOFRAN) 4 MG tablet  Every 6 hours        10/03/23 2150              Dolphus Jenny, PA-C 10/03/23 2151    Vanetta Mulders, MD 10/06/23 1332

## 2023-10-03 NOTE — ED Notes (Signed)
 Pt aware of the need for a urine sample... Unable to currently provide the sample.Jill AasAaron Madden

## 2024-07-27 ENCOUNTER — Encounter: Payer: Self-pay | Admitting: Family Medicine

## 2024-07-27 ENCOUNTER — Other Ambulatory Visit: Payer: Self-pay | Admitting: Family Medicine

## 2024-07-27 DIAGNOSIS — I773 Arterial fibromuscular dysplasia: Secondary | ICD-10-CM

## 2024-07-30 ENCOUNTER — Encounter: Payer: Self-pay | Admitting: Radiology

## 2024-07-31 ENCOUNTER — Inpatient Hospital Stay: Admission: RE | Admit: 2024-07-31 | Discharge: 2024-07-31 | Attending: Family Medicine

## 2024-07-31 DIAGNOSIS — I773 Arterial fibromuscular dysplasia: Secondary | ICD-10-CM

## 2024-07-31 MED ORDER — IOPAMIDOL (ISOVUE-370) INJECTION 76%
100.0000 mL | Freq: Once | INTRAVENOUS | Status: AC | PRN
  Administered 2024-07-31: 100 mL via INTRAVENOUS
